# Patient Record
Sex: Female | Born: 1945 | Race: Black or African American | Hispanic: No | Marital: Married | Smoking: Former smoker
Health system: Southern US, Community
[De-identification: ages and names within clinical notes are randomized; demographics above are authoritative.]

## PROBLEM LIST (undated history)

## (undated) DIAGNOSIS — N289 Disorder of kidney and ureter, unspecified: Secondary | ICD-10-CM

## (undated) DIAGNOSIS — I2510 Atherosclerotic heart disease of native coronary artery without angina pectoris: Secondary | ICD-10-CM

## (undated) DIAGNOSIS — I81 Portal vein thrombosis: Secondary | ICD-10-CM

## (undated) DIAGNOSIS — E119 Type 2 diabetes mellitus without complications: Secondary | ICD-10-CM

## (undated) DIAGNOSIS — I10 Essential (primary) hypertension: Secondary | ICD-10-CM

## (undated) DIAGNOSIS — I480 Paroxysmal atrial fibrillation: Secondary | ICD-10-CM

## (undated) DIAGNOSIS — C9000 Multiple myeloma not having achieved remission: Secondary | ICD-10-CM

## (undated) HISTORY — PX: CORONARY ARTERY BYPASS GRAFT: SHX141

## (undated) HISTORY — PX: PARTIAL HYSTERECTOMY: SHX80

## (undated) HISTORY — PX: COLONOSCOPY: SHX174

## (undated) HISTORY — PX: NASAL SINUS SURGERY: SHX719

## (undated) HISTORY — PX: ESOPHAGOGASTRODUODENOSCOPY: SHX1529

---

## 1999-08-04 ENCOUNTER — Other Ambulatory Visit: Admission: RE | Admit: 1999-08-04 | Discharge: 1999-08-04 | Payer: Self-pay | Admitting: Gynecology

## 2000-10-15 ENCOUNTER — Inpatient Hospital Stay (HOSPITAL_COMMUNITY): Admission: EM | Admit: 2000-10-15 | Discharge: 2000-10-18 | Payer: Self-pay | Admitting: Emergency Medicine

## 2000-10-17 ENCOUNTER — Encounter: Payer: Self-pay | Admitting: Endocrinology

## 2000-10-19 ENCOUNTER — Inpatient Hospital Stay (HOSPITAL_COMMUNITY): Admission: EM | Admit: 2000-10-19 | Discharge: 2000-10-22 | Payer: Self-pay

## 2000-10-19 ENCOUNTER — Encounter: Payer: Self-pay | Admitting: Endocrinology

## 2000-10-25 ENCOUNTER — Other Ambulatory Visit: Admission: RE | Admit: 2000-10-25 | Discharge: 2000-10-25 | Payer: Self-pay | Admitting: Gynecology

## 2001-01-03 ENCOUNTER — Ambulatory Visit (HOSPITAL_COMMUNITY): Admission: RE | Admit: 2001-01-03 | Discharge: 2001-01-04 | Payer: Self-pay | Admitting: Cardiology

## 2001-01-04 ENCOUNTER — Encounter: Payer: Self-pay | Admitting: Cardiology

## 2002-09-14 ENCOUNTER — Ambulatory Visit (HOSPITAL_COMMUNITY): Admission: RE | Admit: 2002-09-14 | Discharge: 2002-09-14 | Payer: Self-pay | Admitting: Cardiology

## 2006-01-18 ENCOUNTER — Emergency Department (HOSPITAL_COMMUNITY): Admission: EM | Admit: 2006-01-18 | Discharge: 2006-01-18 | Payer: Self-pay | Admitting: Emergency Medicine

## 2010-12-07 HISTORY — PX: PACEMAKER IMPLANT: EP1218

## 2017-08-23 ENCOUNTER — Emergency Department (HOSPITAL_COMMUNITY)
Admission: EM | Admit: 2017-08-23 | Discharge: 2017-08-23 | Disposition: A | Discharge status: Home / Self Care | Payer: Medicare Other | Attending: Emergency Medicine | Admitting: Emergency Medicine

## 2017-08-23 ENCOUNTER — Encounter (HOSPITAL_COMMUNITY): Payer: Self-pay | Admitting: Emergency Medicine

## 2017-08-23 DIAGNOSIS — E119 Type 2 diabetes mellitus without complications: Secondary | ICD-10-CM | POA: Insufficient documentation

## 2017-08-23 DIAGNOSIS — Z760 Encounter for issue of repeat prescription: Secondary | ICD-10-CM | POA: Diagnosis present

## 2017-08-23 HISTORY — DX: Disorder of kidney and ureter, unspecified: N28.9

## 2017-08-23 LAB — CBG MONITORING, ED: Glucose-Capillary: 183 mg/dL — ABNORMAL HIGH (ref 65–99)

## 2017-08-23 MED ORDER — FREESTYLE LIBRE 14 DAY READER DEVI: 1.0000 | Freq: Once | 0 refills | Status: AC

## 2017-08-23 MED ORDER — INSULIN GLARGINE 100 UNIT/ML ~~LOC~~ SOLN: 50.0000 [IU] | Freq: Two times a day (BID) | SUBCUTANEOUS | 0 refills | Status: DC

## 2017-08-23 MED ORDER — INSULIN LISPRO 100 UNIT/ML ~~LOC~~ SOLN: 16.0000 [IU] | Freq: Three times a day (TID) | SUBCUTANEOUS | 0 refills | Status: DC

## 2017-08-23 NOTE — ED Triage Notes (Signed)
Pt here for refills of lantis, humulin, and she states her insulin monitoring [place] today[place]. [place].

## 2017-08-23 NOTE — ED Provider Notes (Signed)
[facility] DEPT Provider Note   CSN: [id number] Arrival date & time: 08/23/17  1134     History   Chief Complaint Chief Complaint  Patient presents with  . Medication Refill    HPI Laura Sampson is a 71 y.o. female with a history of diabetes without, location who presents to the emergency department today for medication refill. [place] She only packed supplies for approximately 3 days so she is almost out of her Lantus, Humulin and insulin monitoring system. She states she has not missed any doses but only has one left. She called her primary care doctor who says due to the weather they will be unable to get into the office to refill her prescription for quite some time. Patient is not having any symptoms at this time.  HPI  Past Medical History:  Diagnosis Date  . Diabetes mellitus without complication ([place])   . Renal disorder    "low functioning" no treatment needed at this time    There are no active problems to display for this patient.   Past Surgical History:  Procedure Laterality Date  . NASAL SINUS SURGERY      OB History    No data available       Home Medications    Prior to Admission medications   Medication Sig Start Date End Date Taking? Authorizing Provider  Continuous Blood Gluc Receiver (FREESTYLE LIBRE 14 DAY READER) DEVI 1 Device by Does not apply route once. 08/23/17 08/23/17  Daira Hine, Barth Kirks, PA-C  insulin glargine (LANTUS) 100 UNIT/ML injection Inject 0.5 mLs (50 Units total) into the skin 2 (two) times daily. Inject 50 units subcutaneously every morning and 54 units subcutaneously every evening. 08/23/17   Karolyne Timmons, Barth Kirks, PA-C  insulin lispro (HUMALOG) 100 UNIT/ML injection Inject 0.16 mLs (16 Units total) into the skin 3 (three) times daily before meals. 08/23/17   Makhya Arave, Barth Kirks, PA-C    Family History No family history on file.  Social History Social History  Substance Use  Topics  . Smoking status: Never Smoker  . Smokeless tobacco: Not on file  . Alcohol use No     Allergies   Clindamycin/lincomycin and Penicillins   Review of Systems Review of Systems  Constitutional: Negative for chills and fever.  Respiratory: Negative for shortness of breath.   Cardiovascular: Negative for chest pain.  Gastrointestinal: Negative for nausea and vomiting.  Endocrine: Negative for polydipsia, polyphagia and polyuria.  Neurological: Negative for dizziness, weakness and numbness.     Physical Exam Updated Vital Signs BP (!) 147/64 (BP Location: Left Arm)   Pulse 69   Temp 98.1 F (36.7 C) (Oral)   Resp 15   SpO2 96%   Physical Exam  Constitutional: She appears well-developed and well-nourished.  HENT:  Head: Normocephalic and atraumatic.  Right Ear: External ear normal.  Left Ear: External ear normal.  Eyes: Conjunctivae are normal. Right eye exhibits no discharge. Left eye exhibits no discharge. No scleral icterus.  Pulmonary/Chest: Effort normal. No respiratory distress.  Neurological: She is alert.  Skin: No pallor.  Psychiatric: She has a normal mood and affect.  Nursing note and vitals reviewed.    ED Treatments / Results  Labs (all labs ordered are listed, but only abnormal results are displayed) Labs Reviewed  CBG MONITORING, ED - Abnormal; Notable for the following:       Result Value   Glucose-Capillary 183 (*)  All other components within normal limits    EKG  EKG Interpretation None       Radiology No results found.  Procedures Procedures (including critical care time)  Medications Ordered in ED Medications - No data to display   Initial Impression / Assessment and Plan / ED Course  I have reviewed the triage vital signs and the nursing notes.  Pertinent labs & imaging results that were available during my care of the patient were reviewed by me and considered in my medical decision making (see chart for  details).     Pt here for refill of medication. Medication is not a controlled substance. Will refill medication here. Discussed need to follow up with PCP ASAP.  Pt is safe for discharge at this time.  Final Clinical Impressions(s) / ED Diagnoses   Final diagnoses:  Medication refill    New Prescriptions New Prescriptions   CONTINUOUS BLOOD GLUC RECEIVER (FREESTYLE LIBRE 14 DAY READER) DEVI    1 Device by Does not apply route once.   INSULIN GLARGINE (LANTUS) 100 UNIT/ML INJECTION    Inject 0.5 mLs (50 Units total) into the skin 2 (two) times daily. Inject 50 units subcutaneously every morning and 54 units subcutaneously every evening.   INSULIN LISPRO (HUMALOG) 100 UNIT/ML INJECTION    Inject 0.16 mLs (16 Units total) into the skin 3 (three) times daily before meals.     [name] 08/23/17 1629    Davonna Belling, MD 08/23/17 [phone]

## 2017-08-23 NOTE — Discharge Instructions (Signed)
You were seen here today mediation refill. Take as prescribed. Return for any concerning symptoms. Follow up with your PCP ASAP.

## 2017-11-30 ENCOUNTER — Emergency Department (HOSPITAL_COMMUNITY)
Admission: EM | Admit: 2017-11-30 | Discharge: 2017-11-30 | Disposition: A | Discharge status: Home / Self Care | Payer: Medicare Other | Attending: Emergency Medicine | Admitting: Emergency Medicine

## 2017-11-30 ENCOUNTER — Emergency Department (HOSPITAL_COMMUNITY): Payer: Medicare Other

## 2017-11-30 ENCOUNTER — Encounter (HOSPITAL_COMMUNITY): Payer: Self-pay | Admitting: Radiology

## 2017-11-30 DIAGNOSIS — R569 Unspecified convulsions: Secondary | ICD-10-CM | POA: Insufficient documentation

## 2017-11-30 DIAGNOSIS — E11649 Type 2 diabetes mellitus with hypoglycemia without coma: Secondary | ICD-10-CM | POA: Insufficient documentation

## 2017-11-30 DIAGNOSIS — D649 Anemia, unspecified: Secondary | ICD-10-CM | POA: Insufficient documentation

## 2017-11-30 DIAGNOSIS — N289 Disorder of kidney and ureter, unspecified: Secondary | ICD-10-CM | POA: Diagnosis not present

## 2017-11-30 DIAGNOSIS — R0789 Other chest pain: Secondary | ICD-10-CM | POA: Insufficient documentation

## 2017-11-30 DIAGNOSIS — E162 Hypoglycemia, unspecified: Secondary | ICD-10-CM

## 2017-11-30 DIAGNOSIS — Z7982 Long term (current) use of aspirin: Secondary | ICD-10-CM | POA: Diagnosis not present

## 2017-11-30 DIAGNOSIS — Z794 Long term (current) use of insulin: Secondary | ICD-10-CM | POA: Diagnosis not present

## 2017-11-30 LAB — COMPREHENSIVE METABOLIC PANEL
ALT: 33 U/L (ref 14–54)
ANION GAP: 6 (ref 5–15)
AST: 112 U/L — ABNORMAL HIGH (ref 15–41)
Albumin: 3.5 g/dL (ref 3.5–5.0)
Alkaline Phosphatase: 93 U/L (ref 38–126)
BILIRUBIN TOTAL: 0.8 mg/dL (ref 0.3–1.2)
BUN: 33 mg/dL — ABNORMAL HIGH (ref 6–20)
CHLORIDE: 106 mmol/L (ref 101–111)
CO2: 25 mmol/L (ref 22–32)
Calcium: 8.8 mg/dL — ABNORMAL LOW (ref 8.9–10.3)
Creatinine, Ser: 1.77 mg/dL — ABNORMAL HIGH (ref 0.44–1.00)
GFR calc Af Amer: 32 mL/min — ABNORMAL LOW (ref >60)
GFR, EST NON AFRICAN AMERICAN: 28 mL/min — AB (ref >60)
Glucose, Bld: 83 mg/dL (ref 65–99)
POTASSIUM: 6.1 mmol/L — AB (ref 3.5–5.1)
Sodium: 137 mmol/L (ref 135–145)
TOTAL PROTEIN: 8.2 g/dL — AB (ref 6.5–8.1)

## 2017-11-30 LAB — URINALYSIS, ROUTINE W REFLEX MICROSCOPIC
BILIRUBIN URINE: NEGATIVE
Glucose, UA: NEGATIVE mg/dL
KETONES UR: NEGATIVE mg/dL
LEUKOCYTES UA: NEGATIVE
Nitrite: NEGATIVE
PH: 5 (ref 5.0–8.0)
Protein, ur: NEGATIVE mg/dL
Specific Gravity, Urine: 1.008 (ref 1.005–1.030)

## 2017-11-30 LAB — CBG MONITORING, ED
GLUCOSE-CAPILLARY: 102 mg/dL — AB (ref 65–99)
GLUCOSE-CAPILLARY: 97 mg/dL (ref 65–99)
GLUCOSE-CAPILLARY: 155 mg/dL — AB (ref 65–99)
Glucose-Capillary: 101 mg/dL — ABNORMAL HIGH (ref 65–99)
Glucose-Capillary: 86 mg/dL (ref 65–99)
Glucose-Capillary: 134 mg/dL — ABNORMAL HIGH (ref 65–99)

## 2017-11-30 LAB — CBC WITH DIFFERENTIAL/PLATELET
BASOS ABS: 0 10*3/uL (ref 0.0–0.1)
Basophils Relative: 0 %
EOS PCT: 6 %
Eosinophils Absolute: 0.2 10*3/uL (ref 0.0–0.7)
HEMATOCRIT: 30.7 % — AB (ref 36.0–46.0)
HEMOGLOBIN: 9.7 g/dL — AB (ref 12.0–15.0)
LYMPHS PCT: 36 %
Lymphs Abs: 1.2 10*3/uL (ref 0.7–4.0)
MCH: 29.3 pg (ref 26.0–34.0)
MCHC: 31.6 g/dL (ref 30.0–36.0)
MCV: 92.7 fL (ref 78.0–100.0)
Monocytes Absolute: 0.3 10*3/uL (ref 0.1–1.0)
Monocytes Relative: 10 %
NEUTROS ABS: 1.6 10*3/uL — AB (ref 1.7–7.7)
NEUTROS PCT: 48 %
PLATELETS: 148 10*3/uL — AB (ref 150–400)
RBC: 3.31 MIL/uL — AB (ref 3.87–5.11)
RDW: 16.9 % — ABNORMAL HIGH (ref 11.5–15.5)
WBC: 3.3 10*3/uL — AB (ref 4.0–10.5)

## 2017-11-30 LAB — BASIC METABOLIC PANEL
Anion gap: 5 (ref 5–15)
BUN: 28 mg/dL — AB (ref 6–20)
CHLORIDE: 106 mmol/L (ref 101–111)
CO2: 28 mmol/L (ref 22–32)
Calcium: 8.9 mg/dL (ref 8.9–10.3)
Creatinine, Ser: 1.55 mg/dL — ABNORMAL HIGH (ref 0.44–1.00)
GFR calc Af Amer: 38 mL/min — ABNORMAL LOW (ref >60)
GFR, EST NON AFRICAN AMERICAN: 33 mL/min — AB (ref >60)
GLUCOSE: 160 mg/dL — AB (ref 65–99)
POTASSIUM: 3.9 mmol/L (ref 3.5–5.1)
Sodium: 139 mmol/L (ref 135–145)

## 2017-11-30 MED ORDER — SODIUM CHLORIDE 0.9 % IV BOLUS (SEPSIS)
1000.0000 mL | Freq: Once | INTRAVENOUS | Status: AC
  Administered 2017-11-30: 1000 mL via INTRAVENOUS

## 2017-11-30 MED ORDER — ONDANSETRON HCL 4 MG/2ML IJ SOLN
4.0000 mg | Freq: Once | INTRAMUSCULAR | Status: AC
  Administered 2017-11-30: 4 mg via INTRAVENOUS
  Filled 2017-11-30: qty 2

## 2017-11-30 NOTE — ED Notes (Signed)
Pt given a [place] sandwich, cheese and orange juice

## 2017-11-30 NOTE — ED Notes (Signed)
ED Provider at bedside. 

## 2017-11-30 NOTE — Discharge Instructions (Signed)
You had a seizure tonight because your blood sugar got too low.  Your blood tests showed your kidneys are not working as well as they should, and worse than they were recently.  Creatinine today is 1.78 compared with 1.5 last month.  Because of this, I would like you to decrease your Lantus dose from 14 units twice a day to 10 units twice a day.  Please continue to monitor your blood sugars at home.  If they are running too high, you may need to increase your Lantus dose again.  Call your doctor with this information to get his recommendation on how to adjust your dose.

## 2017-11-30 NOTE — ED Provider Notes (Signed)
Visit shared.  Patient with history of diabetes here following seizure x2 due to hypoglycemia.  She has been eating and drinking well.  No recent medication changes.  On evaluation in the emergency department she is alert and at her neurologic baseline.  She has no acute complaints.  Recheck of her BMP demonstrates improvement in her renal function and resolution of her hyperkalemia.  CT head with no acute abnormalities.  Discussed with patient home care regarding hypoglycemia and seizures.  Discussed regular meals and decreasing her Lantus.  Discussed outpatient follow-up and return precautions.   Quintella Reichert, MD 11/30/17 [phone]

## 2017-11-30 NOTE — ED Notes (Signed)
Family concerned about patient going home after speaking with Primary Care Doctor on call. Spoke with EDP Dr. Ayesha Rumpf about situation. New orders for EKG given. Dr. Ayesha Rumpf will assess momentarily.

## 2017-11-30 NOTE — ED Provider Notes (Signed)
[facility] DEPT Provider Note   CSN: [id number] Arrival date & time: 11/30/17  8657     History   Chief Complaint Chief Complaint  Patient presents with  . Seizures    HPI Laura Sampson is a 71 y.o. female.  The history is provided by the patient and a relative.  She has a history of diabetes mellitus and has had seizures in the past related to hypoglycemia.  Tonight, she woke up and felt funny and tried to check her glucose, but her machine was not reading her glucose.  She woke family members who noted that she was talking incoherently.  She then had a generalized seizure.  The seizure stopped, but she did have a second seizure.  She did not regained normal mentation after either of the seizures.  There was no incontinence and no bit lip or tongue.  The family had been able to give her some sugar prior to the seizure.  EMS arrived, and glucose was 171.  She did have a third seizure.  She was awake and back to her normal mentation when family saw her on arrival to the ED.  Also, she is status post coronary artery bypass in early October.  She had been to urgent care 2 weeks ago for ongoing pain in her left rib cage.  She is continuing to complain of that pain tonight.  Past Medical History:  Diagnosis Date  . Diabetes mellitus without complication ([place])   . Renal disorder    "low functioning" no treatment needed at this time    There are no active problems to display for this patient.   Past Surgical History:  Procedure Laterality Date  . NASAL SINUS SURGERY      OB History    No data available       Home Medications    Prior to Admission medications   Medication Sig Start Date End Date Taking? Authorizing Provider  aspirin EC 81 MG tablet Take 81 mg by mouth daily.   Yes [provider]  insulin glargine (LANTUS) 100 UNIT/ML injection Inject 0.5 mLs (50 Units total) into the skin 2 (two) times daily. Inject 50 units  subcutaneously every morning and 54 units subcutaneously every evening. 08/23/17   Maczis, Barth Kirks, PA-C  insulin lispro (HUMALOG) 100 UNIT/ML injection Inject 0.16 mLs (16 Units total) into the skin 3 (three) times daily before meals. 08/23/17   Maczis, Barth Kirks, PA-C    Family History No family history on file.  Social History Social History   Tobacco Use  . Smoking status: Never Smoker  Substance Use Topics  . Alcohol use: No  . Drug use: No     Allergies   Penicillins and Clindamycin/lincomycin   Review of Systems Review of Systems  All other systems reviewed and are negative.    Physical Exam Updated Vital Signs BP (!) 146/64   Pulse 60   Temp 97.8 F (36.6 C) (Oral)   Resp 18   SpO2 97%   Physical Exam  Nursing note and vitals reviewed.  71 year old female, resting comfortably and in no acute distress. Vital signs are significant for mild hypertension. Oxygen saturation is 97%, which is normal. Head is normocephalic and atraumatic. PERRLA, EOMI. Oropharynx is clear. Neck is nontender and supple without adenopathy or JVD. Back is nontender and there is no CVA tenderness. Lungs are clear without rales, wheezes, or rhonchi. Chest is tender over her sternotomy scar, and mildly  tender in the left lateral rib cage. Heart has regular rate and rhythm without murmur. Abdomen is soft, flat, nontender without masses or hepatosplenomegaly and peristalsis is normoactive. Extremities have no cyanosis or edema, full range of motion is present. Skin is warm and dry without rash. Neurologic: Mental status is normal, cranial nerves are intact, there are no motor or sensory deficits.  ED Treatments / Results  Labs (all labs ordered are listed, but only abnormal results are displayed) Labs Reviewed  CBG MONITORING, ED - Abnormal; Notable for the following components:      Result Value   Glucose-Capillary 101 (*)    All other components within normal limits  CBG  MONITORING, ED - Abnormal; Notable for the following components:   Glucose-Capillary 102 (*)    All other components within normal limits  COMPREHENSIVE METABOLIC PANEL  CBC WITH DIFFERENTIAL/PLATELET  URINALYSIS, ROUTINE W REFLEX MICROSCOPIC  CBG MONITORING, ED    Radiology Dg Chest 2 View  Result Date: 11/30/2017 CLINICAL DATA:  Acute onset of seizures and hypoglycemia. EXAM: CHEST  2 VIEW COMPARISON:  None. FINDINGS: The lungs are well-aerated and clear. There is no evidence of focal opacification, pleural effusion or pneumothorax. The heart is mildly enlarged. The patient is status post median sternotomy. A pacemaker is noted at the right chest wall, with leads ending at the right atrium and right ventricle. No acute osseous abnormalities are seen. Mild calcification is seen along the abdominal aorta. IMPRESSION: Mild cardiomegaly.  Lungs remain grossly clear. Electronically Signed   By: Garald Balding M.D.   On: 11/30/2017 05:52    Procedures Procedures (including critical care time)  Medications Ordered in ED Medications  ondansetron (ZOFRAN) injection 4 mg (not administered)     Initial Impression / Assessment and Plan / ED Course  I have reviewed the triage vital signs and the nursing notes.  Pertinent labs & imaging results that were available during my care of the patient were reviewed by me and considered in my medical decision making (see chart for details).  Seizures which apparently are related to episode of hypoglycemia.  Old records are reviewed showing recent urgent care visit and also hospitalization for bypass surgery.  She is now back to normal mentation.  I do believe the seizures were related to hypoglycemia.  I do not see indication for CT scan currently.  Will check screening labs and observe in the ED. chest wall pain is likely related to recent cardiac surgery, but will check chest x-ray.  Chest x-ray is unremarkable.  Laboratory workup shows anemia and renal  insufficiency.  Checking results in care everywhere, it is noted that creatinine has gone from 1.4-1.5, to 1.78.  Decreased insulin clearance with this change in creatinine may account for her hypoglycemic episode.  She is advised to decrease her Lantus insulin from 14 units twice a day, down to 10 units twice a day.  Continue the same mealtime coverage.  Continue monitoring blood glucose at home and adjust Lantus dose back up to sugars are running too high.  Final Clinical Impressions(s) / ED Diagnoses   Final diagnoses:  Hypoglycemia  Seizure with provoking factor ([place])  Renal insufficiency  Normochromic normocytic anemia    ED Discharge Orders    None       Delora Fuel, MD 67/61/95 [phone]

## 2017-11-30 NOTE — ED Triage Notes (Signed)
Pt BIB [facility]. EMS was called out for hypoglycemia and seizures. Family stated that the pt has seizures when her CBG drops. Family administered oral glucose prior to EMS arrival. When EMS arrived her CBG was normal at 171. Then once pt was loaded into the truck she had a 3rd seizure that lasted 45 seconds and grand mal in nature. Versed 2.5m was administered at that time. Pt is alert to verbal at this time.

## 2018-12-02 ENCOUNTER — Telehealth: Payer: Self-pay | Admitting: Hematology

## 2018-12-02 NOTE — Telephone Encounter (Signed)
Spoke to the pt's daughter and scheduled her to see Dr. Irene Limbo on 1/15 at [place] to arrive 30 minutes early. Voiced understanding.

## 2018-12-14 ENCOUNTER — Emergency Department (HOSPITAL_COMMUNITY): Payer: Medicare Other

## 2018-12-14 ENCOUNTER — Encounter (HOSPITAL_COMMUNITY): Payer: Self-pay | Admitting: Emergency Medicine

## 2018-12-14 ENCOUNTER — Other Ambulatory Visit: Payer: Self-pay

## 2018-12-14 ENCOUNTER — Inpatient Hospital Stay (HOSPITAL_COMMUNITY)
Admission: EM | Admit: 2018-12-14 | Discharge: 2018-12-22 | DRG: 432 | Disposition: A | Discharge status: Home Health | Payer: Medicare Other | Attending: Family Medicine | Admitting: Family Medicine

## 2018-12-14 DIAGNOSIS — R188 Other ascites: Secondary | ICD-10-CM | POA: Diagnosis present

## 2018-12-14 DIAGNOSIS — E119 Type 2 diabetes mellitus without complications: Secondary | ICD-10-CM

## 2018-12-14 DIAGNOSIS — R1084 Generalized abdominal pain: Secondary | ICD-10-CM

## 2018-12-14 DIAGNOSIS — E1122 Type 2 diabetes mellitus with diabetic chronic kidney disease: Secondary | ICD-10-CM | POA: Diagnosis present

## 2018-12-14 DIAGNOSIS — K7460 Unspecified cirrhosis of liver: Secondary | ICD-10-CM | POA: Diagnosis not present

## 2018-12-14 DIAGNOSIS — D62 Acute posthemorrhagic anemia: Secondary | ICD-10-CM

## 2018-12-14 DIAGNOSIS — E11649 Type 2 diabetes mellitus with hypoglycemia without coma: Secondary | ICD-10-CM | POA: Diagnosis not present

## 2018-12-14 DIAGNOSIS — Z79899 Other long term (current) drug therapy: Secondary | ICD-10-CM

## 2018-12-14 DIAGNOSIS — Z951 Presence of aortocoronary bypass graft: Secondary | ICD-10-CM

## 2018-12-14 DIAGNOSIS — Z881 Allergy status to other antibiotic agents status: Secondary | ICD-10-CM

## 2018-12-14 DIAGNOSIS — R140 Abdominal distension (gaseous): Secondary | ICD-10-CM

## 2018-12-14 DIAGNOSIS — R0789 Other chest pain: Secondary | ICD-10-CM

## 2018-12-14 DIAGNOSIS — Z7901 Long term (current) use of anticoagulants: Secondary | ICD-10-CM

## 2018-12-14 DIAGNOSIS — R160 Hepatomegaly, not elsewhere classified: Secondary | ICD-10-CM | POA: Diagnosis present

## 2018-12-14 DIAGNOSIS — C9000 Multiple myeloma not having achieved remission: Secondary | ICD-10-CM | POA: Diagnosis present

## 2018-12-14 DIAGNOSIS — R079 Chest pain, unspecified: Secondary | ICD-10-CM

## 2018-12-14 DIAGNOSIS — N184 Chronic kidney disease, stage 4 (severe): Secondary | ICD-10-CM | POA: Diagnosis present

## 2018-12-14 DIAGNOSIS — A0472 Enterocolitis due to Clostridium difficile, not specified as recurrent: Secondary | ICD-10-CM | POA: Diagnosis present

## 2018-12-14 DIAGNOSIS — I071 Rheumatic tricuspid insufficiency: Secondary | ICD-10-CM | POA: Diagnosis present

## 2018-12-14 DIAGNOSIS — Z803 Family history of malignant neoplasm of breast: Secondary | ICD-10-CM

## 2018-12-14 DIAGNOSIS — N179 Acute kidney failure, unspecified: Secondary | ICD-10-CM

## 2018-12-14 DIAGNOSIS — K8020 Calculus of gallbladder without cholecystitis without obstruction: Secondary | ICD-10-CM | POA: Diagnosis present

## 2018-12-14 DIAGNOSIS — Z90711 Acquired absence of uterus with remaining cervical stump: Secondary | ICD-10-CM

## 2018-12-14 DIAGNOSIS — R040 Epistaxis: Secondary | ICD-10-CM | POA: Diagnosis not present

## 2018-12-14 DIAGNOSIS — Z79890 Hormone replacement therapy (postmenopausal): Secondary | ICD-10-CM

## 2018-12-14 DIAGNOSIS — I81 Portal vein thrombosis: Secondary | ICD-10-CM | POA: Diagnosis present

## 2018-12-14 DIAGNOSIS — E872 Acidosis: Secondary | ICD-10-CM | POA: Diagnosis not present

## 2018-12-14 DIAGNOSIS — I10 Essential (primary) hypertension: Secondary | ICD-10-CM | POA: Diagnosis present

## 2018-12-14 DIAGNOSIS — I700 Atherosclerosis of aorta: Secondary | ICD-10-CM | POA: Diagnosis present

## 2018-12-14 DIAGNOSIS — I2510 Atherosclerotic heart disease of native coronary artery without angina pectoris: Secondary | ICD-10-CM | POA: Diagnosis present

## 2018-12-14 DIAGNOSIS — Z794 Long term (current) use of insulin: Secondary | ICD-10-CM

## 2018-12-14 DIAGNOSIS — Z87891 Personal history of nicotine dependence: Secondary | ICD-10-CM

## 2018-12-14 DIAGNOSIS — K7581 Nonalcoholic steatohepatitis (NASH): Secondary | ICD-10-CM | POA: Diagnosis present

## 2018-12-14 DIAGNOSIS — Z8619 Personal history of other infectious and parasitic diseases: Secondary | ICD-10-CM

## 2018-12-14 DIAGNOSIS — I480 Paroxysmal atrial fibrillation: Secondary | ICD-10-CM | POA: Diagnosis present

## 2018-12-14 DIAGNOSIS — Z8249 Family history of ischemic heart disease and other diseases of the circulatory system: Secondary | ICD-10-CM

## 2018-12-14 DIAGNOSIS — D631 Anemia in chronic kidney disease: Secondary | ICD-10-CM | POA: Diagnosis present

## 2018-12-14 DIAGNOSIS — Z880 Allergy status to penicillin: Secondary | ICD-10-CM

## 2018-12-14 DIAGNOSIS — Z833 Family history of diabetes mellitus: Secondary | ICD-10-CM

## 2018-12-14 DIAGNOSIS — R180 Malignant ascites: Secondary | ICD-10-CM

## 2018-12-14 DIAGNOSIS — Z950 Presence of cardiac pacemaker: Secondary | ICD-10-CM

## 2018-12-14 DIAGNOSIS — M549 Dorsalgia, unspecified: Secondary | ICD-10-CM | POA: Diagnosis not present

## 2018-12-14 DIAGNOSIS — R197 Diarrhea, unspecified: Secondary | ICD-10-CM

## 2018-12-14 DIAGNOSIS — I129 Hypertensive chronic kidney disease with stage 1 through stage 4 chronic kidney disease, or unspecified chronic kidney disease: Secondary | ICD-10-CM | POA: Diagnosis present

## 2018-12-14 DIAGNOSIS — E875 Hyperkalemia: Secondary | ICD-10-CM | POA: Diagnosis not present

## 2018-12-14 HISTORY — DX: Paroxysmal atrial fibrillation: I48.0

## 2018-12-14 HISTORY — DX: Portal vein thrombosis: I81

## 2018-12-14 HISTORY — DX: Type 2 diabetes mellitus without complications: E11.9

## 2018-12-14 HISTORY — DX: Essential (primary) hypertension: I10

## 2018-12-14 HISTORY — DX: Multiple myeloma not having achieved remission: C90.00

## 2018-12-14 HISTORY — DX: Atherosclerotic heart disease of native coronary artery without angina pectoris: I25.10

## 2018-12-14 MED ORDER — FENTANYL CITRATE (PF) 100 MCG/2ML IJ SOLN
50.0000 ug | Freq: Once | INTRAMUSCULAR | Status: AC
  Administered 2018-12-15: 50 ug via INTRAVENOUS
  Filled 2018-12-14: qty 2

## 2018-12-14 NOTE — ED Notes (Signed)
Patient transported to CT 

## 2018-12-14 NOTE — ED Triage Notes (Signed)
Pt c/o abd pain and bloating recent Hx chemo for mass on liver and did not tolerate well currently having a lot of abd pain loss of appetite and right lower back pain

## 2018-12-15 ENCOUNTER — Observation Stay (HOSPITAL_COMMUNITY): Payer: Medicare Other

## 2018-12-15 ENCOUNTER — Encounter (HOSPITAL_COMMUNITY): Payer: Self-pay | Admitting: Emergency Medicine

## 2018-12-15 DIAGNOSIS — K7460 Unspecified cirrhosis of liver: Secondary | ICD-10-CM | POA: Diagnosis not present

## 2018-12-15 DIAGNOSIS — Z794 Long term (current) use of insulin: Secondary | ICD-10-CM

## 2018-12-15 DIAGNOSIS — R1084 Generalized abdominal pain: Secondary | ICD-10-CM

## 2018-12-15 DIAGNOSIS — R188 Other ascites: Secondary | ICD-10-CM | POA: Diagnosis not present

## 2018-12-15 DIAGNOSIS — I10 Essential (primary) hypertension: Secondary | ICD-10-CM

## 2018-12-15 DIAGNOSIS — C9000 Multiple myeloma not having achieved remission: Secondary | ICD-10-CM

## 2018-12-15 DIAGNOSIS — N184 Chronic kidney disease, stage 4 (severe): Secondary | ICD-10-CM

## 2018-12-15 DIAGNOSIS — R160 Hepatomegaly, not elsewhere classified: Secondary | ICD-10-CM

## 2018-12-15 DIAGNOSIS — R197 Diarrhea, unspecified: Secondary | ICD-10-CM

## 2018-12-15 DIAGNOSIS — I480 Paroxysmal atrial fibrillation: Secondary | ICD-10-CM | POA: Diagnosis present

## 2018-12-15 DIAGNOSIS — E1122 Type 2 diabetes mellitus with diabetic chronic kidney disease: Secondary | ICD-10-CM

## 2018-12-15 DIAGNOSIS — A0472 Enterocolitis due to Clostridium difficile, not specified as recurrent: Secondary | ICD-10-CM

## 2018-12-15 DIAGNOSIS — E119 Type 2 diabetes mellitus without complications: Secondary | ICD-10-CM

## 2018-12-15 LAB — APTT
APTT: 34 s (ref 24–36)
aPTT: 59 seconds — ABNORMAL HIGH (ref 24–36)

## 2018-12-15 LAB — BODY FLUID CELL COUNT WITH DIFFERENTIAL
Eos, Fluid: 1 %
Lymphs, Fluid: 73 %
Monocyte-Macrophage-Serous Fluid: 13 % — ABNORMAL LOW (ref 50–90)
Neutrophil Count, Fluid: 13 % (ref 0–25)
WBC FLUID: 85 uL (ref 0–1000)

## 2018-12-15 LAB — CBC WITH DIFFERENTIAL/PLATELET
Abs Immature Granulocytes: 0.01 10*3/uL (ref 0.00–0.07)
Basophils Absolute: 0 10*3/uL (ref 0.0–0.1)
Basophils Relative: 1 %
EOS ABS: 0.2 10*3/uL (ref 0.0–0.5)
Eosinophils Relative: 5 %
HCT: 31.9 % — ABNORMAL LOW (ref 36.0–46.0)
Hemoglobin: 10.1 g/dL — ABNORMAL LOW (ref 12.0–15.0)
IMMATURE GRANULOCYTES: 0 %
Lymphocytes Relative: 11 %
Lymphs Abs: 0.5 10*3/uL — ABNORMAL LOW (ref 0.7–4.0)
MCH: 30.6 pg (ref 26.0–34.0)
MCHC: 31.7 g/dL (ref 30.0–36.0)
MCV: 96.7 fL (ref 80.0–100.0)
Monocytes Absolute: 0.4 10*3/uL (ref 0.1–1.0)
Monocytes Relative: 8 %
Neutro Abs: 3.3 10*3/uL (ref 1.7–7.7)
Neutrophils Relative %: 75 %
Platelets: 134 10*3/uL — ABNORMAL LOW (ref 150–400)
RBC: 3.3 MIL/uL — ABNORMAL LOW (ref 3.87–5.11)
RDW: 18.9 % — ABNORMAL HIGH (ref 11.5–15.5)
WBC: 4.5 10*3/uL (ref 4.0–10.5)
nRBC: 0 % (ref 0.0–0.2)

## 2018-12-15 LAB — GLUCOSE, CAPILLARY
GLUCOSE-CAPILLARY: 163 mg/dL — AB (ref 70–99)
Glucose-Capillary: 176 mg/dL — ABNORMAL HIGH (ref 70–99)
Glucose-Capillary: 177 mg/dL — ABNORMAL HIGH (ref 70–99)

## 2018-12-15 LAB — IRON AND TIBC
Iron: 44 ug/dL (ref 28–170)
SATURATION RATIOS: 14 % (ref 10.4–31.8)
TIBC: 313 ug/dL (ref 250–450)
UIBC: 269 ug/dL

## 2018-12-15 LAB — I-STAT CHEM 8, ED
BUN: 50 mg/dL — ABNORMAL HIGH (ref 8–23)
Calcium, Ion: 0.86 mmol/L — CL (ref 1.15–1.40)
Chloride: 117 mmol/L — ABNORMAL HIGH (ref 98–111)
Creatinine, Ser: 2 mg/dL — ABNORMAL HIGH (ref 0.44–1.00)
Glucose, Bld: 132 mg/dL — ABNORMAL HIGH (ref 70–99)
HEMATOCRIT: 25 % — AB (ref 36.0–46.0)
Hemoglobin: 8.5 g/dL — ABNORMAL LOW (ref 12.0–15.0)
Potassium: 4.8 mmol/L (ref 3.5–5.1)
Sodium: 142 mmol/L (ref 135–145)
TCO2: 19 mmol/L — ABNORMAL LOW (ref 22–32)

## 2018-12-15 LAB — HEPATIC FUNCTION PANEL
ALT: 27 U/L (ref 0–44)
AST: 210 U/L — ABNORMAL HIGH (ref 15–41)
Albumin: 3 g/dL — ABNORMAL LOW (ref 3.5–5.0)
Alkaline Phosphatase: 454 U/L — ABNORMAL HIGH (ref 38–126)
Bilirubin, Direct: 0.2 mg/dL (ref 0.0–0.2)
Indirect Bilirubin: 0.5 mg/dL (ref 0.3–0.9)
Total Bilirubin: 0.7 mg/dL (ref 0.3–1.2)
Total Protein: 8.2 g/dL — ABNORMAL HIGH (ref 6.5–8.1)

## 2018-12-15 LAB — VITAMIN B12: Vitamin B-12: 485 pg/mL (ref 180–914)

## 2018-12-15 LAB — RETICULOCYTES
Immature Retic Fract: 15.1 % (ref 2.3–15.9)
RBC.: 3.25 MIL/uL — ABNORMAL LOW (ref 3.87–5.11)
Retic Count, Absolute: 42.9 10*3/uL (ref 19.0–186.0)
Retic Ct Pct: 1.3 % (ref 0.4–3.1)

## 2018-12-15 LAB — C DIFFICILE QUICK SCREEN W PCR REFLEX
C Diff antigen: NEGATIVE
C Diff interpretation: NOT DETECTED
C Diff toxin: NEGATIVE

## 2018-12-15 LAB — FOLATE: Folate: 8 ng/mL (ref >5.9)

## 2018-12-15 LAB — LIPASE, BLOOD: Lipase: 27 U/L (ref 11–51)

## 2018-12-15 LAB — HEPARIN LEVEL (UNFRACTIONATED)
Heparin Unfractionated: 1.46 IU/mL — ABNORMAL HIGH (ref 0.30–0.70)
Heparin Unfractionated: 0.88 IU/mL — ABNORMAL HIGH (ref 0.30–0.70)
Heparin Unfractionated: 0.75 IU/mL — ABNORMAL HIGH (ref 0.30–0.70)

## 2018-12-15 LAB — FERRITIN: Ferritin: 572 ng/mL — ABNORMAL HIGH (ref 11–307)

## 2018-12-15 MED ORDER — VANCOMYCIN 50 MG/ML ORAL SOLUTION: 125.0000 mg | ORAL | Status: DC

## 2018-12-15 MED ORDER — LOPERAMIDE HCL 2 MG PO CAPS
2.0000 mg | ORAL_CAPSULE | ORAL | Status: DC | PRN
  Administered 2018-12-16: 2 mg via ORAL
  Filled 2018-12-15 (×2): qty 1

## 2018-12-15 MED ORDER — HEPARIN (PORCINE) 25000 UT/250ML-% IV SOLN
1100.0000 [IU]/h | INTRAVENOUS | Status: DC
  Administered 2018-12-15: 1100 [IU]/h via INTRAVENOUS
  Filled 2018-12-15: qty 250

## 2018-12-15 MED ORDER — VANCOMYCIN 50 MG/ML ORAL SOLUTION: 125.0000 mg | Freq: Every day | ORAL | Status: DC

## 2018-12-15 MED ORDER — VANCOMYCIN 50 MG/ML ORAL SOLUTION
125.0000 mg | Freq: Four times a day (QID) | ORAL | Status: DC
  Filled 2018-12-15 (×3): qty 2.5

## 2018-12-15 MED ORDER — OXYCODONE HCL 5 MG PO TABS
5.0000 mg | ORAL_TABLET | ORAL | Status: DC | PRN
  Administered 2018-12-15 – 2018-12-22 (×7): 5 mg via ORAL
  Filled 2018-12-15 (×7): qty 1

## 2018-12-15 MED ORDER — CALCITRIOL 0.25 MCG PO CAPS
0.2500 ug | ORAL_CAPSULE | Freq: Every day | ORAL | Status: DC
  Administered 2018-12-16 – 2018-12-22 (×7): 0.25 ug via ORAL
  Filled 2018-12-15 (×8): qty 1

## 2018-12-15 MED ORDER — MAGNESIUM OXIDE 400 (241.3 MG) MG PO TABS
400.0000 mg | ORAL_TABLET | Freq: Every day | ORAL | Status: DC
  Administered 2018-12-16 – 2018-12-22 (×7): 400 mg via ORAL
  Filled 2018-12-15 (×7): qty 1

## 2018-12-15 MED ORDER — SODIUM BICARBONATE 650 MG PO TABS
650.0000 mg | ORAL_TABLET | Freq: Two times a day (BID) | ORAL | Status: DC
  Administered 2018-12-15 – 2018-12-22 (×13): 650 mg via ORAL
  Filled 2018-12-15 (×13): qty 1

## 2018-12-15 MED ORDER — CARVEDILOL 3.125 MG PO TABS
3.1250 mg | ORAL_TABLET | Freq: Two times a day (BID) | ORAL | Status: DC
  Administered 2018-12-15 – 2018-12-22 (×14): 3.125 mg via ORAL
  Filled 2018-12-15 (×14): qty 1

## 2018-12-15 MED ORDER — DIGOXIN 125 MCG PO TABS
0.1250 mg | ORAL_TABLET | Freq: Every day | ORAL | Status: DC
  Administered 2018-12-16 – 2018-12-22 (×7): 0.125 mg via ORAL
  Filled 2018-12-15 (×8): qty 1

## 2018-12-15 MED ORDER — SPIRONOLACTONE 25 MG PO TABS: 25.0000 mg | ORAL_TABLET | Freq: Every day | ORAL | Status: DC

## 2018-12-15 MED ORDER — MAGNESIUM OXIDE 400 MG PO TABS: 400.0000 mg | ORAL_TABLET | Freq: Every day | ORAL | Status: DC

## 2018-12-15 MED ORDER — ALLOPURINOL 100 MG PO TABS
200.0000 mg | ORAL_TABLET | Freq: Every day | ORAL | Status: DC
  Administered 2018-12-16 – 2018-12-22 (×7): 200 mg via ORAL
  Filled 2018-12-15 (×8): qty 2

## 2018-12-15 MED ORDER — ONDANSETRON HCL 4 MG/2ML IJ SOLN
4.0000 mg | Freq: Four times a day (QID) | INTRAMUSCULAR | Status: DC | PRN
  Administered 2018-12-19 – 2018-12-20 (×2): 4 mg via INTRAVENOUS
  Filled 2018-12-15 (×2): qty 2

## 2018-12-15 MED ORDER — INSULIN ASPART 100 UNIT/ML ~~LOC~~ SOLN: 0.0000 [IU] | Freq: Three times a day (TID) | SUBCUTANEOUS | Status: DC

## 2018-12-15 MED ORDER — ONDANSETRON HCL 4 MG PO TABS: 4.0000 mg | ORAL_TABLET | Freq: Four times a day (QID) | ORAL | Status: DC | PRN

## 2018-12-15 MED ORDER — HYDRALAZINE HCL 25 MG PO TABS
25.0000 mg | ORAL_TABLET | Freq: Three times a day (TID) | ORAL | Status: DC
  Administered 2018-12-15 – 2018-12-19 (×13): 25 mg via ORAL
  Filled 2018-12-15 (×14): qty 1

## 2018-12-15 MED ORDER — INSULIN NPH (HUMAN) (ISOPHANE) 100 UNIT/ML ~~LOC~~ SUSP: 45.0000 [IU] | Freq: Two times a day (BID) | SUBCUTANEOUS | Status: DC

## 2018-12-15 MED ORDER — ACETAMINOPHEN 325 MG PO TABS
650.0000 mg | ORAL_TABLET | Freq: Four times a day (QID) | ORAL | Status: DC | PRN
  Administered 2018-12-15 – 2018-12-20 (×5): 650 mg via ORAL
  Filled 2018-12-15 (×5): qty 2

## 2018-12-15 MED ORDER — GABAPENTIN 100 MG PO CAPS
100.0000 mg | ORAL_CAPSULE | Freq: Two times a day (BID) | ORAL | Status: DC
  Administered 2018-12-15 – 2018-12-22 (×13): 100 mg via ORAL
  Filled 2018-12-15 (×14): qty 1

## 2018-12-15 MED ORDER — HEPARIN (PORCINE) 25000 UT/250ML-% IV SOLN
950.0000 [IU]/h | INTRAVENOUS | Status: DC
  Administered 2018-12-15: 950 [IU]/h via INTRAVENOUS
  Filled 2018-12-15: qty 250

## 2018-12-15 MED ORDER — FENTANYL CITRATE (PF) 100 MCG/2ML IJ SOLN: 25.0000 ug | INTRAMUSCULAR | Status: DC | PRN

## 2018-12-15 MED ORDER — ACETAMINOPHEN 650 MG RE SUPP: 650.0000 mg | Freq: Four times a day (QID) | RECTAL | Status: DC | PRN

## 2018-12-15 MED ORDER — LEVOTHYROXINE SODIUM 75 MCG PO TABS
75.0000 ug | ORAL_TABLET | Freq: Every day | ORAL | Status: DC
  Administered 2018-12-15 – 2018-12-22 (×7): 75 ug via ORAL
  Filled 2018-12-15 (×8): qty 1

## 2018-12-15 MED ORDER — VANCOMYCIN 50 MG/ML ORAL SOLUTION: 125.0000 mg | Freq: Two times a day (BID) | ORAL | Status: DC

## 2018-12-15 MED ORDER — INSULIN NPH (HUMAN) (ISOPHANE) 100 UNIT/ML ~~LOC~~ SUSP
35.0000 [IU] | Freq: Two times a day (BID) | SUBCUTANEOUS | Status: DC
  Administered 2018-12-15: 35 [IU] via SUBCUTANEOUS
  Filled 2018-12-15: qty 10

## 2018-12-15 MED ORDER — LIDOCAINE HCL 1 % IJ SOLN
INTRAMUSCULAR | Status: AC
  Filled 2018-12-15: qty 20

## 2018-12-15 MED ORDER — BUMETANIDE 0.5 MG PO TABS: 0.5000 mg | ORAL_TABLET | Freq: Every day | ORAL | Status: DC

## 2018-12-15 NOTE — Progress Notes (Signed)
I have seen and assessed patient and agree with Dr. Juleen China assessment and plan.  Patient is a pleasant 73 year old female history of type 2 diabetes, hypertension, coronary artery disease status post CABG, status post PP M, recent diagnosis of multiple myeloma, chronic kidney disease who was recently hospitalized outside hospital for abdominal pain swelling diarrhea during the work-up patient noted to have a cirrhotic liver.  Patient noted to have a positive C. difficile PCR and patient started on oral Vanco.  Patient also noted to be on Bumex and Aldactone.  Patient presented to the ED with worsening abdominal distention, ongoing diarrhea.  Patient awaiting ultrasound-guided paracentesis.  Repeat C. difficile PCR negative.  Supportive care.  No charge.

## 2018-12-15 NOTE — ED Provider Notes (Signed)
[facility] DEPT Provider Note   CSN: [id number] Arrival date & time: 12/14/18  2204     History   Chief Complaint Chief Complaint  Patient presents with  . Abdominal Pain    HPI Laura Sampson is a 73 y.o. female.  The history is provided by the patient and a relative.  Abdominal Pain  Pain location:  Generalized Pain quality: bloating   Pain radiates to:  Does not radiate Pain severity:  Severe Onset quality:  Gradual Duration:  5 weeks Timing:  Constant Progression:  Worsening Chronicity:  New Context: not alcohol use and not trauma   Relieved by:  Nothing Worsened by:  Nothing Ineffective treatments:  None tried Associated symptoms: diarrhea   Associated symptoms: no chest pain, no constipation, no dysuria, no fever and no shortness of breath   Risk factors: recent hospitalization   Risk factors: no alcohol abuse     Past Medical History:  Diagnosis Date  . Diabetes mellitus without complication ([place])   . Renal disorder    "low functioning" no treatment needed at this time    There are no active problems to display for this patient.   Past Surgical History:  Procedure Laterality Date  . NASAL SINUS SURGERY       OB History   No obstetric history on file.      Home Medications    Prior to Admission medications   Medication Sig Start Date End Date Taking? Authorizing Provider  acetaminophen (TYLENOL) 500 MG tablet Take 1,000 mg by mouth every 6 (six) hours as needed for mild pain, moderate pain, fever or headache.   Yes [provider]  allopurinol (ZYLOPRIM) 100 MG tablet Take 200 mg by mouth daily.   Yes [provider]  apixaban (ELIQUIS) 5 MG TABS tablet Take 5 mg by mouth 2 (two) times daily.   Yes [provider]  bumetanide (BUMEX) 0.5 MG tablet Take 0.5 mg by mouth daily.   Yes [provider]  calcitRIOL (ROCALTROL) 0.25 MCG capsule Take 0.25 mcg by mouth daily.   Yes  [provider]  carvedilol (COREG) 3.125 MG tablet Take 3.125 mg by mouth 2 (two) times daily with a meal.   Yes [provider]  digoxin (LANOXIN) 0.125 MG tablet Take 0.125 mg by mouth daily.    Yes [provider]  gabapentin (NEURONTIN) 100 MG capsule Take 100 mg by mouth 2 (two) times daily.   Yes [provider]  hydrALAZINE (APRESOLINE) 25 MG tablet Take 25 mg by mouth 3 (three) times daily.    Yes [provider]  insulin lispro (HUMALOG) 100 UNIT/ML injection Inject 0.16 mLs (16 Units total) into the skin 3 (three) times daily before meals. Patient taking differently: Inject 8 Units into the skin 3 (three) times daily before meals.  08/23/17  Yes Maczis, Barth Kirks, PA-C  insulin NPH Human (HUMULIN N,NOVOLIN N) 100 UNIT/ML injection Inject 45 Units into the skin 2 (two) times daily. 12/04/18 02/09/19 Yes [provider]  levothyroxine (SYNTHROID, LEVOTHROID) 75 MCG tablet Take 75 mcg by mouth daily before breakfast.   Yes [provider]  magnesium oxide (MAG-OX) 400 MG tablet Take 400 mg by mouth daily.   Yes [provider]  sodium bicarbonate 650 MG tablet Take 650 mg by mouth 2 (two) times daily.   Yes [provider]  spironolactone (ALDACTONE) 25 MG tablet Take 25 mg by mouth daily.   Yes [provider]  insulin glargine (LANTUS) 100 UNIT/ML injection Inject 0.5 mLs (50 Units total) into the skin 2 (two) times daily. Inject 50 units subcutaneously every morning and 54 units subcutaneously every evening. Patient not taking: Reported on 12/15/2018 08/23/17   Maczis, Barth Kirks, PA-C    Family History History reviewed. No pertinent family history.  Social History Social History   Tobacco Use  . Smoking status: Never Smoker  . Smokeless tobacco: Never Used  Substance Use Topics  . Alcohol use: No  . Drug use: No     Allergies   Penicillins and Clindamycin/lincomycin   Review of  Systems Review of Systems  Constitutional: Negative for diaphoresis and fever.  Respiratory: Negative for chest tightness and shortness of breath.   Cardiovascular: Negative for chest pain and leg swelling.  Gastrointestinal: Positive for abdominal pain and diarrhea. Negative for constipation.  Genitourinary: Negative for dysuria and pelvic pain.  All other systems reviewed and are negative.    Physical Exam Updated Vital Signs BP (!) 150/51 (BP Location: Left Arm)   Pulse (!) 102   Temp 98.4 F (36.9 C) (Oral)   Resp 17   SpO2 94%   Physical Exam Vitals signs and nursing note reviewed.  Constitutional:      Appearance: She is well-developed. She is not diaphoretic.  HENT:     Head: Normocephalic and atraumatic.     Mouth/Throat:     Mouth: Mucous membranes are moist.  Eyes:     Extraocular Movements: Extraocular movements intact.     Pupils: Pupils are equal, round, and reactive to light.  Cardiovascular:     Rate and Rhythm: Normal rate and regular rhythm.     Heart sounds: Normal heart sounds.  Pulmonary:     Effort: Pulmonary effort is normal. No respiratory distress.     Breath sounds: Normal breath sounds.  Abdominal:     General: Abdomen is protuberant. Bowel sounds are normal. There is distension. There are no signs of injury.     Palpations: Abdomen is soft. There is shifting dullness and fluid wave.     Tenderness: There is generalized abdominal tenderness.     Hernia: No hernia is present.  Genitourinary:    Rectum: Normal.  Skin:    General: Skin is warm and dry.     Capillary Refill: Capillary refill takes less than 2 seconds.  Neurological:     General: No focal deficit present.     Mental Status: She is alert.  Psychiatric:        Mood and Affect: Mood normal.        Behavior: Behavior normal.      ED Treatments / Results  Labs (all labs ordered are listed, but only abnormal results are displayed) Results for orders placed or performed during  the hospital encounter of 11/30/17  Comprehensive metabolic panel  Result Value Ref Range   Sodium 137 135 - 145 mmol/L   Potassium 6.1 (H) 3.5 - 5.1 mmol/L   Chloride 106 101 - 111 mmol/L   CO2 25 22 - 32 mmol/L   Glucose, Bld 83 65 - 99 mg/dL   BUN 33 (H) 6 - 20 mg/dL   Creatinine, Ser 1.77 (H) 0.44 - 1.00 mg/dL   Calcium 8.8 (L) 8.9 - 10.3 mg/dL   Total Protein 8.2 (H) 6.5 - 8.1 g/dL   Albumin 3.5 3.5 - 5.0 g/dL   AST 112 (H) 15 - 41 U/L   ALT 33 14 - 54  U/L   Alkaline Phosphatase 93 38 - 126 U/L   Total Bilirubin 0.8 0.3 - 1.2 mg/dL   GFR calc non Af Amer 28 (L) >60 mL/min   GFR calc Af Amer 32 (L) >60 mL/min   Anion gap 6 5 - 15  CBC with Differential  Result Value Ref Range   WBC 3.3 (L) 4.0 - 10.5 K/uL   RBC 3.31 (L) 3.87 - 5.11 MIL/uL   Hemoglobin 9.7 (L) 12.0 - 15.0 g/dL   HCT 30.7 (L) 36.0 - 46.0 %   MCV 92.7 78.0 - 100.0 fL   MCH 29.3 26.0 - 34.0 pg   MCHC 31.6 30.0 - 36.0 g/dL   RDW 16.9 (H) 11.5 - 15.5 %   Platelets 148 (L) 150 - 400 K/uL   Neutrophils Relative % 48 %   Neutro Abs 1.6 (L) 1.7 - 7.7 K/uL   Lymphocytes Relative 36 %   Lymphs Abs 1.2 0.7 - 4.0 K/uL   Monocytes Relative 10 %   Monocytes Absolute 0.3 0.1 - 1.0 K/uL   Eosinophils Relative 6 %   Eosinophils Absolute 0.2 0.0 - 0.7 K/uL   Basophils Relative 0 %   Basophils Absolute 0.0 0.0 - 0.1 K/uL  Urinalysis, Routine w reflex microscopic  Result Value Ref Range   Color, Urine STRAW (A) YELLOW   APPearance CLEAR CLEAR   Specific Gravity, Urine 1.008 1.005 - 1.030   pH 5.0 5.0 - 8.0   Glucose, UA NEGATIVE NEGATIVE mg/dL   Hgb urine dipstick SMALL (A) NEGATIVE   Bilirubin Urine NEGATIVE NEGATIVE   Ketones, ur NEGATIVE NEGATIVE mg/dL   Protein, ur NEGATIVE NEGATIVE mg/dL   Nitrite NEGATIVE NEGATIVE   Leukocytes, UA NEGATIVE NEGATIVE   RBC / HPF 0-5 0 - 5 RBC/hpf   WBC, UA 0-5 0 - 5 WBC/hpf   Bacteria, UA RARE (A) NONE SEEN   Squamous Epithelial / LPF 0-5 (A) NONE SEEN   Mucus PRESENT    Basic metabolic panel  Result Value Ref Range   Sodium 139 135 - 145 mmol/L   Potassium 3.9 3.5 - 5.1 mmol/L   Chloride 106 101 - 111 mmol/L   CO2 28 22 - 32 mmol/L   Glucose, Bld 160 (H) 65 - 99 mg/dL   BUN 28 (H) 6 - 20 mg/dL   Creatinine, Ser 1.55 (H) 0.44 - 1.00 mg/dL   Calcium 8.9 8.9 - 10.3 mg/dL   GFR calc non Af Amer 33 (L) >60 mL/min   GFR calc Af Amer 38 (L) >60 mL/min   Anion gap 5 5 - 15  CBG monitoring, ED  Result Value Ref Range   Glucose-Capillary 101 (H) 65 - 99 mg/dL   Comment 1 Notify RN   CBG monitoring, ED  Result Value Ref Range   Glucose-Capillary 97 65 - 99 mg/dL  CBG monitoring, ED  Result Value Ref Range   Glucose-Capillary 102 (H) 65 - 99 mg/dL  CBG monitoring, ED  Result Value Ref Range   Glucose-Capillary 86 65 - 99 mg/dL  CBG monitoring, ED  Result Value Ref Range   Glucose-Capillary 155 (H) 65 - 99 mg/dL  CBG monitoring, ED  Result Value Ref Range   Glucose-Capillary 134 (H) 65 - 99 mg/dL   Ct Renal Stone Study  Result Date: 12/15/2018 CLINICAL DATA:  Abdominal distention and pain for 2 weeks. EXAM: CT ABDOMEN AND PELVIS WITHOUT CONTRAST TECHNIQUE: Multidetector CT imaging of the abdomen and pelvis was performed following the  standard protocol without IV contrast. COMPARISON:  Chest CT report 10/19/2000 FINDINGS: Lower chest: Cardiomegaly with right atrial and right ventricular leads identified. No pericardial effusion or thickening. Mild bronchiectasis to the lower lobes with left lower lobe atelectasis. No effusion or pneumothorax. Hepatobiliary: Cirrhotic appearing liver without definite space-occupying mass given limitations of a noncontrast study. Tiny granuloma in the right hepatic lobe. Gallbladder is physiologically distended and contains layering gallstones. No mural thickening is identified. Pancreas: Normal without ductal dilatation Spleen: Normal size spleen without mass. Adrenals/Urinary Tract: Normal bilateral adrenal glands and kidneys  without nephrolithiasis nor hydroureteronephrosis. The urinary bladder is unremarkable. Stomach/Bowel: Stomach is within normal limits. Appendix is not confidently identified. No evidence of bowel wall thickening, distention, or inflammatory changes. Vascular/Lymphatic: Moderate aortoiliac atherosclerosis. Lymphadenopathy. Reproductive: Uterus appears surgically absent. No adnexal mass is seen. Other: Moderate to large volume of ascites with soft tissue anasarca. Musculoskeletal: No acute nor suspicious osseous abnormalities. Mild anterior height loss of L4 without retropulsion, likely chronic. Facet arthrosis is noted of the lumbar spine. IMPRESSION: 1. Moderate to large volume of ascites with soft tissue anasarca. 2. Cirrhotic appearing liver without definite space-occupying mass given limitations of a noncontrast study. 3. Uncomplicated cholelithiasis. 4. Aortoiliac atherosclerosis. Electronically Signed   By: Ashley Royalty M.D.   On: 12/15/2018 00:22    EKG None  Radiology Ct Renal Stone Study  Result Date: 12/15/2018 CLINICAL DATA:  Abdominal distention and pain for 2 weeks. EXAM: CT ABDOMEN AND PELVIS WITHOUT CONTRAST TECHNIQUE: Multidetector CT imaging of the abdomen and pelvis was performed following the standard protocol without IV contrast. COMPARISON:  Chest CT report 10/19/2000 FINDINGS: Lower chest: Cardiomegaly with right atrial and right ventricular leads identified. No pericardial effusion or thickening. Mild bronchiectasis to the lower lobes with left lower lobe atelectasis. No effusion or pneumothorax. Hepatobiliary: Cirrhotic appearing liver without definite space-occupying mass given limitations of a noncontrast study. Tiny granuloma in the right hepatic lobe. Gallbladder is physiologically distended and contains layering gallstones. No mural thickening is identified. Pancreas: Normal without ductal dilatation Spleen: Normal size spleen without mass. Adrenals/Urinary Tract: Normal bilateral  adrenal glands and kidneys without nephrolithiasis nor hydroureteronephrosis. The urinary bladder is unremarkable. Stomach/Bowel: Stomach is within normal limits. Appendix is not confidently identified. No evidence of bowel wall thickening, distention, or inflammatory changes. Vascular/Lymphatic: Moderate aortoiliac atherosclerosis. Lymphadenopathy. Reproductive: Uterus appears surgically absent. No adnexal mass is seen. Other: Moderate to large volume of ascites with soft tissue anasarca. Musculoskeletal: No acute nor suspicious osseous abnormalities. Mild anterior height loss of L4 without retropulsion, likely chronic. Facet arthrosis is noted of the lumbar spine. IMPRESSION: 1. Moderate to large volume of ascites with soft tissue anasarca. 2. Cirrhotic appearing liver without definite space-occupying mass given limitations of a noncontrast study. 3. Uncomplicated cholelithiasis. 4. Aortoiliac atherosclerosis. Electronically Signed   By: Ashley Royalty M.D.   On: 12/15/2018 00:22    Procedures Procedures (including critical care time)  Medications Ordered in ED Medications  fentaNYL (SUBLIMAZE) injection 50 mcg (50 mcg Intravenous Given 12/15/18 0017)      Final Clinical Impressions(s) / ED Diagnoses   Will admit to medicine for pain and Rule out SBP on anti coagulation.      Taylan Mayhan, MD 12/15/18 3428

## 2018-12-15 NOTE — ED Notes (Signed)
ED TO INPATIENT HANDOFF REPORT  Name/Age/Gender Laura Sampson 73 y.o. female  Code Status    Code Status Orders  (From admission, onward)         Start     Ordered   12/15/18 0246  Full code  Continuous     12/15/18 0246        Code Status History    This patient has a current code status but no historical code status.    Advance Directive Documentation     Most Recent Value  Type of Advance Directive  Living will  Pre-existing out of facility DNR order (yellow form or pink MOST form)  -  "MOST" Form in Place?  -      Home/SNF/Other Home  Chief Complaint Abdominal Swelling  Level of Care/Admitting Diagnosis ED Disposition    ED Disposition Condition [facility]: [facility] [[id number]]  Level of Care: Med-Surg [16]  Diagnosis: Ascites [470962]  Admitting Physician: Etta Quill [[id number]]  Attending Physician: Etta Quill [[id number]]  PT Class (Do Not Modify): Observation [104]  PT Acc Code (Do Not Modify): Observation [10022]       Medical History Past Medical History:  Diagnosis Date  . CAD (coronary artery disease)   . Diabetes mellitus without complication ([place])   . DM2 (diabetes mellitus, type 2) ([place])   . HTN (hypertension)   . HTN (hypertension)   . PAF (paroxysmal atrial fibrillation) ([place])   . Portal vein thrombosis   . Renal disorder    "low functioning" no treatment needed at this time    Allergies Allergies  Allergen Reactions  . Penicillins Rash    Has patient had a PCN reaction causing immediate rash, facial/tongue/throat swelling, SOB or lightheadedness with hypotension: Yes Has patient had a PCN reaction causing severe rash involving mucus membranes or skin necrosis: Yes Has patient had a PCN reaction that required hospitalization: No Has patient had a PCN reaction occurring within the last 10 years: Unknown If all of the above answers are "NO", then may proceed with Cephalosporin use.    . Clindamycin/Lincomycin Itching    IV Location/Drains/Wounds Patient Lines/Drains/Airways Status   Active Line/Drains/Airways    Name:   Placement date:   Placement time:   Site:   Days:   Peripheral IV 12/15/18 Right Antecubital   12/15/18    0019    Antecubital   less than 1          Labs/Imaging Results for orders placed or performed during the hospital encounter of 12/14/18 (from the past 48 hour(s))  Lipase, blood     Status: None   Collection Time: 12/14/18 11:13 PM  Result Value Ref Range   Lipase 27 11 - 51 U/L    Comment: Performed at [facility], [place] [address], [place], [place] [address]  CBC with Diff     Status: Abnormal   Collection Time: 12/14/18 11:13 PM  Result Value Ref Range   WBC 4.5 4.0 - 10.5 K/uL   RBC 3.30 (L) 3.87 - 5.11 MIL/uL   Hemoglobin 10.1 (L) 12.0 - 15.0 g/dL   HCT 31.9 (L) 36.0 - 46.0 %   MCV 96.7 80.0 - 100.0 fL   MCH 30.6 26.0 - 34.0 pg   MCHC 31.7 30.0 - 36.0 g/dL   RDW 18.9 (H) 11.5 - 15.5 %   Platelets 134 (L) 150 - 400 K/uL   nRBC 0.0 0.0 -  0.2 %   Neutrophils Relative % 75 %   Neutro Abs 3.3 1.7 - 7.7 K/uL   Lymphocytes Relative 11 %   Lymphs Abs 0.5 (L) 0.7 - 4.0 K/uL   Monocytes Relative 8 %   Monocytes Absolute 0.4 0.1 - 1.0 K/uL   Eosinophils Relative 5 %   Eosinophils Absolute 0.2 0.0 - 0.5 K/uL   Basophils Relative 1 %   Basophils Absolute 0.0 0.0 - 0.1 K/uL   Immature Granulocytes 0 %   Abs Immature Granulocytes 0.01 0.00 - 0.07 K/uL    Comment: Performed at [facility], [place] [address], [place], [place] [address]  Hepatic function panel     Status: Abnormal   Collection Time: 12/14/18 11:13 PM  Result Value Ref Range   Total Protein 8.2 (H) 6.5 - 8.1 g/dL   Albumin 3.0 (L) 3.5 - 5.0 g/dL   AST 210 (H) 15 - 41 U/L   ALT 27 0 - 44 U/L   Alkaline Phosphatase 454 (H) 38 - 126 U/L   Total Bilirubin 0.7 0.3 - 1.2 mg/dL   Bilirubin, Direct 0.2 0.0 - 0.2 mg/dL   Indirect Bilirubin  0.5 0.3 - 0.9 mg/dL    Comment: Performed at [facility], [place] [address] [place], [place] [address]  I-stat chem 8, ed     Status: Abnormal   Collection Time: 12/15/18  2:12 AM  Result Value Ref Range   Sodium 142 135 - 145 mmol/L   Potassium 4.8 3.5 - 5.1 mmol/L   Chloride 117 (H) 98 - 111 mmol/L   BUN 50 (H) 8 - 23 mg/dL   Creatinine, Ser 2.00 (H) 0.44 - 1.00 mg/dL   Glucose, Bld 132 (H) 70 - 99 mg/dL   Calcium, Ion 0.86 (LL) 1.15 - 1.40 mmol/L   TCO2 19 (L) 22 - 32 mmol/L   Hemoglobin 8.5 (L) 12.0 - 15.0 g/dL   HCT 25.0 (L) 36.0 - 46.0 %   Comment NOTIFIED PHYSICIAN    Ct Renal Stone Study  Result Date: 12/15/2018 CLINICAL DATA:  Abdominal distention and pain for 2 weeks. EXAM: CT ABDOMEN AND PELVIS WITHOUT CONTRAST TECHNIQUE: Multidetector CT imaging of the abdomen and pelvis was performed following the standard protocol without IV contrast. COMPARISON:  Chest CT report 10/19/2000 FINDINGS: Lower chest: Cardiomegaly with right atrial and right ventricular leads identified. No pericardial effusion or thickening. Mild bronchiectasis to the lower lobes with left lower lobe atelectasis. No effusion or pneumothorax. Hepatobiliary: Cirrhotic appearing liver without definite space-occupying mass given limitations of a noncontrast study. Tiny granuloma in the right hepatic lobe. Gallbladder is physiologically distended and contains layering gallstones. No mural thickening is identified. Pancreas: Normal without ductal dilatation Spleen: Normal size spleen without mass. Adrenals/Urinary Tract: Normal bilateral adrenal glands and kidneys without nephrolithiasis nor hydroureteronephrosis. The urinary bladder is unremarkable. Stomach/Bowel: Stomach is within normal limits. Appendix is not confidently identified. No evidence of bowel wall thickening, distention, or inflammatory changes. Vascular/Lymphatic: Moderate aortoiliac atherosclerosis. Lymphadenopathy. Reproductive: Uterus  appears surgically absent. No adnexal mass is seen. Other: Moderate to large volume of ascites with soft tissue anasarca. Musculoskeletal: No acute nor suspicious osseous abnormalities. Mild anterior height loss of L4 without retropulsion, likely chronic. Facet arthrosis is noted of the lumbar spine. IMPRESSION: 1. Moderate to large volume of ascites with soft tissue anasarca. 2. Cirrhotic appearing liver without definite space-occupying mass given limitations of a noncontrast study. 3. Uncomplicated cholelithiasis. 4. Aortoiliac atherosclerosis. Electronically Signed  By: Ashley Royalty M.D.   On: 12/15/2018 00:22    Pending Labs Unresulted Labs (From admission, onward)    Start     Ordered   12/16/18 0500  CBC  Daily,   R     12/15/18 0309   12/15/18 1200  Heparin level (unfractionated)  Once-Timed,   R     12/15/18 0326   12/15/18 1200  APTT  Once-Timed,   R     12/15/18 0326   12/15/18 0253  Heparin level (unfractionated)  ONCE - STAT,   R     12/15/18 0253   12/15/18 0253  APTT  ONCE - STAT,   R     12/15/18 0253   12/15/18 0004  Gastrointestinal Panel by PCR , Stool  (Gastrointestinal Panel by PCR, Stool)  Once,   R     12/15/18 0003   12/15/18 0004  C difficile quick scan w PCR reflex  (C Difficile quick screen w PCR reflex panel)  Once, for 24 hours,   R     12/15/18 0003   12/14/18 2313  Urinalysis, Routine w reflex microscopic  ONCE - STAT,   STAT     12/14/18 2313          Vitals/Pain Today's Vitals   12/15/18 0000 12/15/18 0214 12/15/18 0215 12/15/18 0321  BP: 138/79 (!) 146/58    Pulse: (!) 112 70    Resp: (!) 25 17    Temp:      TempSrc:      SpO2: 100% 99%    Weight:    68 kg  Height:    5' 2"  (1.575 m)  PainSc:   7      Isolation Precautions Enteric precautions (UV disinfection)  Medications Medications  carvedilol (COREG) tablet 3.125 mg (has no administration in time range)  calcitRIOL (ROCALTROL) capsule 0.25 mcg (has no administration in time range)   vancomycin (VANCOCIN) 50 mg/mL oral solution 125 mg (has no administration in time range)    Followed by  vancomycin (VANCOCIN) 50 mg/mL oral solution 125 mg (has no administration in time range)    Followed by  vancomycin (VANCOCIN) 50 mg/mL oral solution 125 mg (has no administration in time range)    Followed by  vancomycin (VANCOCIN) 50 mg/mL oral solution 125 mg (has no administration in time range)    Followed by  vancomycin (VANCOCIN) 50 mg/mL oral solution 125 mg (has no administration in time range)  insulin aspart (novoLOG) injection 0-15 Units (has no administration in time range)  insulin NPH Human (HUMULIN N,NOVOLIN N) injection 35 Units (has no administration in time range)  digoxin (LANOXIN) tablet 0.125 mg (has no administration in time range)  gabapentin (NEURONTIN) capsule 100 mg (has no administration in time range)  hydrALAZINE (APRESOLINE) tablet 25 mg (has no administration in time range)  levothyroxine (SYNTHROID, LEVOTHROID) tablet 75 mcg (has no administration in time range)  sodium bicarbonate tablet 650 mg (has no administration in time range)  magnesium oxide (MAG-OX) tablet 400 mg (has no administration in time range)  allopurinol (ZYLOPRIM) tablet 200 mg (has no administration in time range)  acetaminophen (TYLENOL) tablet 650 mg (has no administration in time range)    Or  acetaminophen (TYLENOL) suppository 650 mg (has no administration in time range)  ondansetron (ZOFRAN) tablet 4 mg (has no administration in time range)    Or  ondansetron (ZOFRAN) injection 4 mg (has no administration in time range)  fentaNYL (SUBLIMAZE) injection 25-50  mcg (has no administration in time range)  heparin ADULT infusion 100 units/mL (25000 units/268m sodium chloride 0.45%) (has no administration in time range)  fentaNYL (SUBLIMAZE) injection 50 mcg (50 mcg Intravenous Given 12/15/18 0017)    Mobility walks with person assist

## 2018-12-15 NOTE — Progress Notes (Signed)
ANTICOAGULATION CONSULT NOTE  Pharmacy Consult for heparin Indication: hx atrial fibrillation, portal vein thrombosis (home Eliquis on hold)  Patient Measurements: weight 68 kg, height 62 inches Height: 5' 2"  (157.5 cm) Weight: 150 lb (68 kg) IBW/kg (Calculated) : 50.1 Heparin Dosing Weight: 64 kg  Vital Signs: Temp: 97.8 F (36.6 C) (01/09 0636) Temp Source: Oral (01/09 0636) BP: 118/59 (01/09 0636) Pulse Rate: 70 (01/09 0636)  Labs: Recent Labs    12/14/18 2313 12/15/18 0212 12/15/18 0253 12/15/18 1341  HGB 10.1* 8.5*  --   --   HCT 31.9* 25.0*  --   --   PLT 134*  --   --   --   APTT  --   --  34 59*  HEPARINUNFRC  --   --  1.46* 0.88*  CREATININE  --  2.00*  --   --     Estimated Creatinine Clearance: 23 mL/min (A) (by C-G formula based on SCr of 2 mg/dL (H)).   Medications:  - on eliquis 5 mg bid PTA (last dose 12/14/18 at 0900)  Assessment: Patient's a 73 y.o F with hx multiple myeloma, SBP, Afib, and portal vein thrombosis on Eliquis PTA, presented to the ED on 12/15/18 with c/o abd pain.  CT on 12/15/18 showed moderate to large volume of ascites.  To transition anticoagulant from Eliquis to heparin drip in anticipation for paracentesis. First aPTT below goal at 59 seconds after heparin drip started with no bolus at rate of 950 units/hr. Heparin level 0.88 reflects recent eliquis dose.  Hg 10.1 at 2313 and 8.5 drawn 3 hrs later.   Goal of Therapy:  Heparin level 0.3-0.7 units/ml Monitor platelets by anticoagulation protocol: Yes  aPTT goal 66-102 seconds   Plan:  - increae heparin drip to 1100 units/hr - check 8 hr heparin level and aPTT - monitor for s/s bleeding - please advise pharmacy if/when heparin drip needs to be held for procedure  Eudelia Bunch, Pharm.D [phone] 12/15/2018 2:28 PM

## 2018-12-15 NOTE — Procedures (Signed)
PROCEDURE SUMMARY:  Successful image-guided paracentesis from the right lower abdomen.  Yielded 3.2 liters of hazy amber fluid.  No immediate complications.  Patient tolerated well.   Specimen was sent for labs.  Claris Pong Louk PA-C 12/15/2018 3:25 PM

## 2018-12-15 NOTE — Progress Notes (Addendum)
[place] for heparin Indication: hx atrial fibrillation, portal vein thrombosis (home Eliquis on hold)  Patient Measurements: weight 68 kg, height 62 inches   Heparin Dosing Weight: 64 kg  Vital Signs: Temp: 98.4 F (36.9 C) (01/08 2225) Temp Source: Oral (01/08 2225) BP: 146/58 (01/09 0214) Pulse Rate: 70 (01/09 0214)  Labs: Recent Labs    12/14/18 2313 12/15/18 0212  HGB 10.1* 8.5*  HCT 31.9* 25.0*  PLT 134*  --   CREATININE  --  2.00*    CrCl cannot be calculated (Unknown ideal weight.).   Medications:  - on eliquis 5 mg bid PTA (last dose 12/14/18 at 0900)  Assessment: Patient's a 73 y.o F with hx multiple myeloma, SBP, Afib, and portal vein thrombosis on Eliquis PTA, presented to the ED on 12/15/18 with c/o abd pain.  CT on 12/15/18 showed moderate to large volume of ascites.  To transition anticoagulant from Eliquis to heparin drip in anticipation for paracentesis.   Goal of Therapy:  Heparin level 0.3-0.7 units/ml Monitor platelets by anticoagulation protocol: Yes   Plan:  - check baseline aPTT and heparin level now - heparin drip at 950 units/hr - check 8 hr heparin level and aPTT - monitor for s/s bleeding - please advise pharmacy if/when heparin drip needs to be held for procedure   Paul Torpey P 12/15/2018,2:44 AM

## 2018-12-15 NOTE — ED Notes (Signed)
Pls.  bring patient [phone]

## 2018-12-15 NOTE — H&P (Signed)
History and Physical    Laura Sampson TML:[id number] DOB: [birth year] DOA: 12/14/2018  PCP: System, Pcp Not In  Patient coming from: Home  I have personally briefly reviewed patient's old medical records in [place]  Chief Complaint: Abd pain, diarrhea  HPI: Laura Sampson is a 73 y.o. female with medical history significant of DM2, HTN, CAD s/p CABG, PPM.  Recent diagnosis of MM.  Patient recently hospitalized in OSH at end of December for abd pain, swelling, diarrhea.  Work up during hospital stay showed cirrhotic appearance of liver, ascites, though not enough ascites apparently to tap when they tried.  Stool came back positive for C.Diff.  Patient was started on PO vanc, bumex, and aldactone.  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  Also saw a 2.3cm right lobe hepatic mass on [place] 12/21, though the same mass couldn't be visualized on repeat US x5 days later on the 26th.   ED Course: ALK 454, Creat 2.0 AST 210 ALT 27 (about what they had been running at OSH).  CT abd/pelvis shows: Moderate to large volume of ascites with soft tissue anasarca.   Review of Systems: As per HPI otherwise 10 point review of systems negative.   Past Medical History:  Diagnosis Date  . CAD (coronary artery disease)   . Diabetes mellitus without complication ([place])   . DM2 (diabetes mellitus, type 2) ([place])   . HTN (hypertension)   . HTN (hypertension)   . PAF (paroxysmal atrial fibrillation) ([place])   . Portal vein thrombosis   . Renal disorder    "low functioning" no treatment needed at this time    Past Surgical History:  Procedure Laterality Date  . COLONOSCOPY    . CORONARY ARTERY BYPASS GRAFT    . ESOPHAGOGASTRODUODENOSCOPY    . NASAL SINUS SURGERY    . PACEMAKER IMPLANT  2012   [employer]  . PARTIAL HYSTERECTOMY       reports that she quit smoking about 37 years ago. She quit after 4.00 years of use. She has never used smokeless tobacco. She reports that she does  not drink alcohol or use drugs.  Allergies  Allergen Reactions  . Penicillins Rash    Has patient had a PCN reaction causing immediate rash, facial/tongue/throat swelling, SOB or lightheadedness with hypotension: Yes Has patient had a PCN reaction causing severe rash involving mucus membranes or skin necrosis: Yes Has patient had a PCN reaction that required hospitalization: No Has patient had a PCN reaction occurring within the last 10 years: Unknown If all of the above answers are "NO", then may proceed with Cephalosporin use.   . Clindamycin/Lincomycin Itching    Family History  Problem Relation Age of Onset  . Diabetes Mother   . Breast cancer Mother   . Diabetes Father   . Heart disease Father   . Diabetes Sister      Prior to Admission medications   Medication Sig Start Date End Date Taking? Authorizing Provider  acetaminophen (TYLENOL) 500 MG tablet Take 1,000 mg by mouth every 6 (six) hours as needed for mild pain, moderate pain, fever or headache.   Yes [provider]  allopurinol (ZYLOPRIM) 100 MG tablet Take 200 mg by mouth daily.   Yes [provider]  apixaban (ELIQUIS) 5 MG TABS tablet Take 5 mg by mouth 2 (two) times daily.   Yes [provider]  bumetanide (BUMEX) 0.5 MG tablet Take 0.5 mg by mouth daily.  Yes [provider]  calcitRIOL (ROCALTROL) 0.25 MCG capsule Take 0.25 mcg by mouth daily.   Yes [provider]  carvedilol (COREG) 3.125 MG tablet Take 3.125 mg by mouth 2 (two) times daily with a meal.   Yes [provider]  digoxin (LANOXIN) 0.125 MG tablet Take 0.125 mg by mouth daily.    Yes [provider]  gabapentin (NEURONTIN) 100 MG capsule Take 100 mg by mouth 2 (two) times daily.   Yes [provider]  hydrALAZINE (APRESOLINE) 25 MG tablet Take 25 mg by mouth 3 (three) times daily.    Yes [provider]  insulin lispro (HUMALOG) 100 UNIT/ML injection Inject 0.16 mLs  (16 Units total) into the skin 3 (three) times daily before meals. Patient taking differently: Inject 8 Units into the skin 3 (three) times daily before meals.  08/23/17  Yes Maczis, Barth Kirks, PA-C  insulin NPH Human (HUMULIN N,NOVOLIN N) 100 UNIT/ML injection Inject 45 Units into the skin 2 (two) times daily. 12/04/18 02/09/19 Yes [provider]  levothyroxine (SYNTHROID, LEVOTHROID) 75 MCG tablet Take 75 mcg by mouth daily before breakfast.   Yes [provider]  magnesium oxide (MAG-OX) 400 MG tablet Take 400 mg by mouth daily.   Yes [provider]  sodium bicarbonate 650 MG tablet Take 650 mg by mouth 2 (two) times daily.   Yes [provider]  spironolactone (ALDACTONE) 25 MG tablet Take 25 mg by mouth daily.   Yes [provider]    Physical Exam: Vitals:   12/14/18 2300 12/15/18 0000 12/15/18 0214 12/15/18 0321  BP: 140/71 138/79 (!) 146/58   Pulse: 69 (!) 112 70   Resp: (!) 27 (!) 25 17   Temp:      TempSrc:      SpO2: 100% 100% 99%   Weight:    68 kg  Height:    5' 2"  (1.575 m)    Constitutional: NAD, calm, comfortable Eyes: PERRL, lids and conjunctivae normal ENMT: Mucous membranes are moist. Posterior pharynx clear of any exudate or lesions.Normal dentition.  Neck: normal, supple, no masses, no thyromegaly Respiratory: clear to auscultation bilaterally, no wheezing, no crackles. Normal respiratory effort. No accessory muscle use.  Cardiovascular: Regular rate and rhythm, no murmurs / rubs / gallops. No extremity edema. 2+ pedal pulses. No carotid bruits.  Abdomen: Distended with fluid wave, generalized TTP without guarding or rebound. Musculoskeletal: no clubbing / cyanosis. No joint deformity upper and lower extremities. Good ROM, no contractures. Normal muscle tone.  Skin: no rashes, lesions, ulcers. No induration Neurologic: CN 2-12 grossly intact. Sensation intact, DTR normal. Strength 5/5 in all 4.  Psychiatric: Normal  judgment and insight. Alert and oriented x 3. Normal mood.    Labs on Admission: I have personally reviewed following labs and imaging studies  CBC: Recent Labs  Lab 12/14/18 2313 12/15/18 0212  WBC 4.5  --   NEUTROABS 3.3  --   HGB 10.1* 8.5*  HCT 31.9* 25.0*  MCV 96.7  --   PLT 134*  --    Basic Metabolic Panel: Recent Labs  Lab 12/15/18 0212  NA 142  K 4.8  CL 117*  GLUCOSE 132*  BUN 50*  CREATININE 2.00*   GFR: Estimated Creatinine Clearance: 23 mL/min (A) (by C-G formula based on SCr of 2 mg/dL (H)). Liver Function Tests: Recent Labs  Lab 12/14/18 2313  AST 210*  ALT 27  ALKPHOS 454*  BILITOT 0.7  PROT 8.2*  ALBUMIN 3.0*   Recent Labs  Lab 12/14/18 2313  LIPASE 27   No results for input(s): AMMONIA in the last 168 hours. Coagulation Profile: No results for input(s): INR, PROTIME in the last 168 hours. Cardiac Enzymes: No results for input(s): CKTOTAL, CKMB, CKMBINDEX, TROPONINI in the last 168 hours. BNP (last 3 results) No results for input(s): PROBNP in the last 8760 hours. HbA1C: No results for input(s): HGBA1C in the last 72 hours. CBG: No results for input(s): GLUCAP in the last 168 hours. Lipid Profile: No results for input(s): CHOL, HDL, LDLCALC, TRIG, CHOLHDL, LDLDIRECT in the last 72 hours. Thyroid Function Tests: No results for input(s): TSH, T4TOTAL, FREET4, T3FREE, THYROIDAB in the last 72 hours. Anemia Panel: No results for input(s): VITAMINB12, FOLATE, FERRITIN, TIBC, IRON, RETICCTPCT in the last 72 hours. Urine analysis:    Component Value Date/Time   COLORURINE STRAW (A) 11/30/2017 0550   APPEARANCEUR CLEAR 11/30/2017 0550   LABSPEC 1.008 11/30/2017 0550   PHURINE 5.0 11/30/2017 0550   GLUCOSEU NEGATIVE 11/30/2017 0550   HGBUR SMALL (A) 11/30/2017 0550   BILIRUBINUR NEGATIVE 11/30/2017 0550   KETONESUR NEGATIVE 11/30/2017 0550   PROTEINUR NEGATIVE 11/30/2017 0550   NITRITE NEGATIVE 11/30/2017 0550   LEUKOCYTESUR NEGATIVE  11/30/2017 0550    Radiological Exams on Admission: Ct Renal Stone Study  Result Date: 12/15/2018 CLINICAL DATA:  Abdominal distention and pain for 2 weeks. EXAM: CT ABDOMEN AND PELVIS WITHOUT CONTRAST TECHNIQUE: Multidetector CT imaging of the abdomen and pelvis was performed following the standard protocol without IV contrast. COMPARISON:  Chest CT report 10/19/2000 FINDINGS: Lower chest: Cardiomegaly with right atrial and right ventricular leads identified. No pericardial effusion or thickening. Mild bronchiectasis to the lower lobes with left lower lobe atelectasis. No effusion or pneumothorax. Hepatobiliary: Cirrhotic appearing liver without definite space-occupying mass given limitations of a noncontrast study. Tiny granuloma in the right hepatic lobe. Gallbladder is physiologically distended and contains layering gallstones. No mural thickening is identified. Pancreas: Normal without ductal dilatation Spleen: Normal size spleen without mass. Adrenals/Urinary Tract: Normal bilateral adrenal glands and kidneys without nephrolithiasis nor hydroureteronephrosis. The urinary bladder is unremarkable. Stomach/Bowel: Stomach is within normal limits. Appendix is not confidently identified. No evidence of bowel wall thickening, distention, or inflammatory changes. Vascular/Lymphatic: Moderate aortoiliac atherosclerosis. Lymphadenopathy. Reproductive: Uterus appears surgically absent. No adnexal mass is seen. Other: Moderate to large volume of ascites with soft tissue anasarca. Musculoskeletal: No acute nor suspicious osseous abnormalities. Mild anterior height loss of L4 without retropulsion, likely chronic. Facet arthrosis is noted of the lumbar spine. IMPRESSION: 1. Moderate to large volume of ascites with soft tissue anasarca. 2. Cirrhotic appearing liver without definite space-occupying mass given limitations of a noncontrast study. 3. Uncomplicated cholelithiasis. 4. Aortoiliac atherosclerosis. Electronically  Signed   By: Ashley Royalty M.D.   On: 12/15/2018 00:22    EKG: Independently reviewed.  Assessment/Plan Principal Problem:   Clostridium difficile diarrhea Active Problems:   Myeloma ([place])   Liver mass   Cirrhosis ([facility])   Ascites   DM2 (diabetes mellitus, type 2) ([facility])   HTN (hypertension)   PAF (paroxysmal atrial fibrillation) ([facility])   CKD (chronic kidney disease) stage 4, GFR 15-29 ml/min ([place])    1. Diarrhea - 1. Diagnosed as C.Diff diarrhea at OSH 2. Will restart PO vanc for the moment 3. However, seems odd that there was no change at all in symptoms despite 8 days of PO vanc (not even temporary improvement). 4. GI pathogen pnl pending 5. Holding  Bumex, Spironolactone for the moment 6. Consider ID consult after above back 2. Abd pain, ascites - 1. SBP seems less likely given 5 week h/o symptoms, no fever, WBC, peritoneal symptoms 2. Will re-attempt paracentesis they wernt able to obtain at OSH x2 weeks ago 3. Urinalysis pending 3. Cirrhosis - needs GI fu 4. ? Liver mass - 1. 2.3cm R lobe, concerning for neoplasm, seen on RUQ [place] on 12/21, repeat US to take a better look at the mass x5 days later and they couldn't find it 2. Plan GI eval / follow up 3. Cant MRI patient due to PPM 5. Myeloma - recent diagnosis needs oncology f/u 6. PAF - 1. Continue coreg and digoxin 2. Holding eliquis, heparin gtt instead, plan for US paracentesis 7. HTN - continue home BP meds 8. DM2 - 1. Insulin NPH 35u BID while in hospital (on 45 BID at home) 2. Mod scale SSI AC 9. CKD stage 4 - 1. Creat 2.0 today appears baseline 2. Continue PO bicarb  DVT prophylaxis: heparin gtt - using in place of eliquis for the moment while IR tries to perform paracentesis Code Status: Full Family Communication: Family at bedside Disposition Plan: Home after admit Consults called: None Admission status: Place in obs - convert to IP if not discharged tomorrow (seems very likely that she will need to be  converted to IP)   [place], [place] [facility] Pager [phone] Only works nights!  If 7AM-7PM, please contact the primary day team physician taking care of patient  [email or web address] Password [email or web address]  12/15/2018, 3:26 AM

## 2018-12-16 ENCOUNTER — Observation Stay (HOSPITAL_COMMUNITY): Payer: Medicare Other

## 2018-12-16 DIAGNOSIS — K7460 Unspecified cirrhosis of liver: Secondary | ICD-10-CM | POA: Diagnosis present

## 2018-12-16 DIAGNOSIS — D62 Acute posthemorrhagic anemia: Secondary | ICD-10-CM | POA: Diagnosis not present

## 2018-12-16 DIAGNOSIS — K8020 Calculus of gallbladder without cholecystitis without obstruction: Secondary | ICD-10-CM | POA: Diagnosis present

## 2018-12-16 DIAGNOSIS — E11649 Type 2 diabetes mellitus with hypoglycemia without coma: Secondary | ICD-10-CM | POA: Diagnosis not present

## 2018-12-16 DIAGNOSIS — E1122 Type 2 diabetes mellitus with diabetic chronic kidney disease: Secondary | ICD-10-CM | POA: Diagnosis present

## 2018-12-16 DIAGNOSIS — Z8619 Personal history of other infectious and parasitic diseases: Secondary | ICD-10-CM | POA: Diagnosis not present

## 2018-12-16 DIAGNOSIS — M549 Dorsalgia, unspecified: Secondary | ICD-10-CM | POA: Diagnosis not present

## 2018-12-16 DIAGNOSIS — R1084 Generalized abdominal pain: Secondary | ICD-10-CM | POA: Diagnosis present

## 2018-12-16 DIAGNOSIS — D631 Anemia in chronic kidney disease: Secondary | ICD-10-CM | POA: Diagnosis present

## 2018-12-16 DIAGNOSIS — I700 Atherosclerosis of aorta: Secondary | ICD-10-CM | POA: Diagnosis present

## 2018-12-16 DIAGNOSIS — I370 Nonrheumatic pulmonary valve stenosis: Secondary | ICD-10-CM | POA: Diagnosis not present

## 2018-12-16 DIAGNOSIS — R180 Malignant ascites: Secondary | ICD-10-CM

## 2018-12-16 DIAGNOSIS — I129 Hypertensive chronic kidney disease with stage 1 through stage 4 chronic kidney disease, or unspecified chronic kidney disease: Secondary | ICD-10-CM | POA: Diagnosis present

## 2018-12-16 DIAGNOSIS — I480 Paroxysmal atrial fibrillation: Secondary | ICD-10-CM | POA: Diagnosis present

## 2018-12-16 DIAGNOSIS — R0789 Other chest pain: Secondary | ICD-10-CM | POA: Diagnosis not present

## 2018-12-16 DIAGNOSIS — E872 Acidosis: Secondary | ICD-10-CM | POA: Diagnosis not present

## 2018-12-16 DIAGNOSIS — I071 Rheumatic tricuspid insufficiency: Secondary | ICD-10-CM | POA: Diagnosis present

## 2018-12-16 DIAGNOSIS — R197 Diarrhea, unspecified: Secondary | ICD-10-CM | POA: Diagnosis present

## 2018-12-16 DIAGNOSIS — K7581 Nonalcoholic steatohepatitis (NASH): Secondary | ICD-10-CM | POA: Diagnosis present

## 2018-12-16 DIAGNOSIS — C9000 Multiple myeloma not having achieved remission: Secondary | ICD-10-CM | POA: Diagnosis present

## 2018-12-16 DIAGNOSIS — N179 Acute kidney failure, unspecified: Secondary | ICD-10-CM | POA: Diagnosis not present

## 2018-12-16 DIAGNOSIS — Z87891 Personal history of nicotine dependence: Secondary | ICD-10-CM | POA: Diagnosis not present

## 2018-12-16 DIAGNOSIS — R188 Other ascites: Secondary | ICD-10-CM | POA: Diagnosis present

## 2018-12-16 DIAGNOSIS — N184 Chronic kidney disease, stage 4 (severe): Secondary | ICD-10-CM | POA: Diagnosis present

## 2018-12-16 DIAGNOSIS — I2510 Atherosclerotic heart disease of native coronary artery without angina pectoris: Secondary | ICD-10-CM | POA: Diagnosis present

## 2018-12-16 DIAGNOSIS — E875 Hyperkalemia: Secondary | ICD-10-CM | POA: Diagnosis not present

## 2018-12-16 DIAGNOSIS — I340 Nonrheumatic mitral (valve) insufficiency: Secondary | ICD-10-CM | POA: Diagnosis not present

## 2018-12-16 DIAGNOSIS — R040 Epistaxis: Secondary | ICD-10-CM | POA: Diagnosis not present

## 2018-12-16 DIAGNOSIS — I81 Portal vein thrombosis: Secondary | ICD-10-CM | POA: Diagnosis present

## 2018-12-16 DIAGNOSIS — A0472 Enterocolitis due to Clostridium difficile, not specified as recurrent: Secondary | ICD-10-CM | POA: Diagnosis not present

## 2018-12-16 LAB — GASTROINTESTINAL PANEL BY PCR, STOOL (REPLACES STOOL CULTURE)

## 2018-12-16 LAB — GRAM STAIN

## 2018-12-16 LAB — URINALYSIS, ROUTINE W REFLEX MICROSCOPIC
Bilirubin Urine: NEGATIVE
Glucose, UA: NEGATIVE mg/dL
Hgb urine dipstick: NEGATIVE
Ketones, ur: NEGATIVE mg/dL
Leukocytes, UA: NEGATIVE
Nitrite: NEGATIVE
PH: 5 (ref 5.0–8.0)
Protein, ur: NEGATIVE mg/dL
Specific Gravity, Urine: 1.014 (ref 1.005–1.030)

## 2018-12-16 LAB — HEPATITIS PANEL, ACUTE
HCV Ab: <0.1 s/co ratio (ref 0.0–0.9)
HEP B S AG: NEGATIVE
Hep A IgM: NEGATIVE
Hep B C IgM: NEGATIVE

## 2018-12-16 LAB — GLUCOSE, CAPILLARY
Glucose-Capillary: 34 mg/dL — CL (ref 70–99)
Glucose-Capillary: 80 mg/dL (ref 70–99)
Glucose-Capillary: 123 mg/dL — ABNORMAL HIGH (ref 70–99)
Glucose-Capillary: 149 mg/dL — ABNORMAL HIGH (ref 70–99)
Glucose-Capillary: 135 mg/dL — ABNORMAL HIGH (ref 70–99)
Glucose-Capillary: 171 mg/dL — ABNORMAL HIGH (ref 70–99)

## 2018-12-16 LAB — CBC
HCT: 30.2 % — ABNORMAL LOW (ref 36.0–46.0)
HCT: 28.1 % — ABNORMAL LOW (ref 36.0–46.0)
Hemoglobin: 9.5 g/dL — ABNORMAL LOW (ref 12.0–15.0)
Hemoglobin: 8.8 g/dL — ABNORMAL LOW (ref 12.0–15.0)
MCH: 30.1 pg (ref 26.0–34.0)
MCH: 29.6 pg (ref 26.0–34.0)
MCHC: 31.5 g/dL (ref 30.0–36.0)
MCHC: 31.3 g/dL (ref 30.0–36.0)
MCV: 95.6 fL (ref 80.0–100.0)
MCV: 94.6 fL (ref 80.0–100.0)
Platelets: 125 10*3/uL — ABNORMAL LOW (ref 150–400)
Platelets: 120 10*3/uL — ABNORMAL LOW (ref 150–400)
RBC: 3.16 MIL/uL — ABNORMAL LOW (ref 3.87–5.11)
RBC: 2.97 MIL/uL — ABNORMAL LOW (ref 3.87–5.11)
RDW: 18.5 % — ABNORMAL HIGH (ref 11.5–15.5)
RDW: 18.6 % — AB (ref 11.5–15.5)
WBC: 3.4 10*3/uL — ABNORMAL LOW (ref 4.0–10.5)
WBC: 3.2 10*3/uL — ABNORMAL LOW (ref 4.0–10.5)
nRBC: 0 % (ref 0.0–0.2)
nRBC: 0 % (ref 0.0–0.2)

## 2018-12-16 LAB — COMPREHENSIVE METABOLIC PANEL
ALT: 21 U/L (ref 0–44)
AST: 176 U/L — ABNORMAL HIGH (ref 15–41)
Albumin: 2.4 g/dL — ABNORMAL LOW (ref 3.5–5.0)
Alkaline Phosphatase: 359 U/L — ABNORMAL HIGH (ref 38–126)
Anion gap: 7 (ref 5–15)
BILIRUBIN TOTAL: 0.6 mg/dL (ref 0.3–1.2)
BUN: 43 mg/dL — ABNORMAL HIGH (ref 8–23)
CO2: 22 mmol/L (ref 22–32)
Calcium: 8.5 mg/dL — ABNORMAL LOW (ref 8.9–10.3)
Chloride: 110 mmol/L (ref 98–111)
Creatinine, Ser: 2.56 mg/dL — ABNORMAL HIGH (ref 0.44–1.00)
GFR calc Af Amer: 21 mL/min — ABNORMAL LOW (ref >60)
GFR calc non Af Amer: 18 mL/min — ABNORMAL LOW (ref >60)
Glucose, Bld: 106 mg/dL — ABNORMAL HIGH (ref 70–99)
Potassium: 4.3 mmol/L (ref 3.5–5.1)
Sodium: 139 mmol/L (ref 135–145)
Total Protein: 6.9 g/dL (ref 6.5–8.1)

## 2018-12-16 LAB — PH, BODY FLUID: pH, Body Fluid: 7.5

## 2018-12-16 LAB — HEPARIN LEVEL (UNFRACTIONATED)
HEPARIN UNFRACTIONATED: 0.61 [IU]/mL (ref 0.30–0.70)
Heparin Unfractionated: 0.84 IU/mL — ABNORMAL HIGH (ref 0.30–0.70)
Heparin Unfractionated: 0.9 IU/mL — ABNORMAL HIGH (ref 0.30–0.70)

## 2018-12-16 LAB — HEMOGLOBIN A1C
Hgb A1c MFr Bld: 8.7 % — ABNORMAL HIGH (ref 4.8–5.6)
Mean Plasma Glucose: 202.99 mg/dL

## 2018-12-16 LAB — MAGNESIUM: Magnesium: 2.1 mg/dL (ref 1.7–2.4)

## 2018-12-16 LAB — APTT: APTT: 135 s — AB (ref 24–36)

## 2018-12-16 LAB — CREATININE, URINE, RANDOM: Creatinine, Urine: 155.02 mg/dL

## 2018-12-16 LAB — SODIUM, URINE, RANDOM: Sodium, Ur: 57 mmol/L

## 2018-12-16 MED ORDER — SODIUM CHLORIDE 0.9 % IV SOLN
INTRAVENOUS | Status: DC
  Administered 2018-12-16 – 2018-12-17 (×2): via INTRAVENOUS

## 2018-12-16 MED ORDER — HEPARIN (PORCINE) 25000 UT/250ML-% IV SOLN
1000.0000 [IU]/h | INTRAVENOUS | Status: DC
  Administered 2018-12-16 (×2): 1000 [IU]/h via INTRAVENOUS
  Filled 2018-12-16 (×2): qty 250

## 2018-12-16 MED ORDER — INSULIN ASPART 100 UNIT/ML ~~LOC~~ SOLN
0.0000 [IU] | Freq: Three times a day (TID) | SUBCUTANEOUS | Status: DC
  Administered 2018-12-16 (×2): 1 [IU] via SUBCUTANEOUS
  Administered 2018-12-17 (×3): 2 [IU] via SUBCUTANEOUS
  Administered 2018-12-18: 1 [IU] via SUBCUTANEOUS
  Administered 2018-12-18 – 2018-12-19 (×3): 2 [IU] via SUBCUTANEOUS
  Administered 2018-12-19: 5 [IU] via SUBCUTANEOUS
  Administered 2018-12-19 – 2018-12-20 (×2): 3 [IU] via SUBCUTANEOUS
  Administered 2018-12-20: 2 [IU] via SUBCUTANEOUS
  Administered 2018-12-20 – 2018-12-21 (×3): 3 [IU] via SUBCUTANEOUS
  Administered 2018-12-22: 2 [IU] via SUBCUTANEOUS

## 2018-12-16 MED ORDER — HEPARIN (PORCINE) 25000 UT/250ML-% IV SOLN
900.0000 [IU]/h | INTRAVENOUS | Status: DC
  Filled 2018-12-16: qty 250

## 2018-12-16 MED ORDER — ALBUMIN HUMAN 25 % IV SOLN
25.0000 g | Freq: Four times a day (QID) | INTRAVENOUS | Status: AC
  Administered 2018-12-16 – 2018-12-17 (×4): 25 g via INTRAVENOUS
  Filled 2018-12-16 (×5): qty 100

## 2018-12-16 MED ORDER — OXYMETAZOLINE HCL 0.05 % NA SOLN
2.0000 | Freq: Two times a day (BID) | NASAL | Status: AC
  Administered 2018-12-16 – 2018-12-17 (×2): 2 via NASAL
  Filled 2018-12-16: qty 15

## 2018-12-16 NOTE — Progress Notes (Signed)
Hypoglycemic Event  CBG:34 @0432   Treatment: two cups of orange juice with couple of packet of sugar given  Symptoms: dizziness   Follow-up CBG: Time 0503 CBG -80  Possible Reasons for Event:may be  insuline at bed time Comments/MD notified: paged on call  X. [place], No any new order received.    Karlin Binion D Levette Paulick

## 2018-12-16 NOTE — Progress Notes (Signed)
Active w/[facility] rep Ronalee Belts aware-HHRN/HHPT.

## 2018-12-16 NOTE — Progress Notes (Signed)
ANTICOAGULATION CONSULT NOTE  Pharmacy Consult for heparin Indication: hx atrial fibrillation, portal vein thrombosis (home Eliquis on hold)  Patient Measurements: weight 68 kg, height 62 inches Height: 5' 2"  (157.5 cm) Weight: 150 lb (68 kg) IBW/kg (Calculated) : 50.1 Heparin Dosing Weight: 64 kg  Vital Signs: Temp: 98.6 F (37 C) (01/10 2134) Temp Source: Oral (01/10 2134) BP: 123/58 (01/10 2134) Pulse Rate: 82 (01/10 2134)  Labs: Recent Labs    12/14/18 2313 12/15/18 0212  12/15/18 0253 12/15/18 1341  12/16/18 0147 12/16/18 1218 12/16/18 2111  HGB 10.1* 8.5*  --   --   --   --  9.5*  --   --   HCT 31.9* 25.0*  --   --   --   --  30.2*  --   --   PLT 134*  --   --   --   --   --  125*  --   --   APTT  --   --   --  34 59*  --  135*  --   --   HEPARINUNFRC  --   --    < > 1.46* 0.88*   < > 0.84* 0.90* 0.61  CREATININE  --  2.00*  --   --   --   --  2.56*  --   --    < > = values in this interval not displayed.    Estimated Creatinine Clearance: 18 mL/min (A) (by C-G formula based on SCr of 2.56 mg/dL (H)).   Medications:  - on eliquis 5 mg bid PTA (last dose 12/14/18 at 0900)  Assessment: Patient's a 73 y.o F with hx multiple myeloma, SBP, Afib, and portal vein thrombosis on Eliquis PTA, presented to the ED on 12/15/18 with c/o abd pain.  CT on 12/15/18 showed moderate to large volume of ascites.  To transition anticoagulant from Eliquis to heparin drip in anticipation for paracentesis.  12/16/2018   Most recent heparin level therapeutic on 900 units/hr  CBC stable  No documented bleeding  Goal of Therapy:  Heparin level 0.3-0.7 units/ml Monitor platelets by anticoagulation protocol: Yes  aPTT goal 66-102 seconds   Plan:  - Continue heparin drip at 900 units/hr - recheck HL with AM labs - Daily CBC and HL - monitor for s/s bleeding - f/u to resume home [place], PharmD, BCPS [phone] 12/16/2018, 10:17 PM

## 2018-12-16 NOTE — Care Management Obs Status (Signed)
[facility] NOTIFICATION   Patient Details  Name: Laura Sampson MRN: [id number] Date of Birth: [birth year]   Medicare Observation Status Notification Given:  Yes    MahabirJuliann Pulse, RN 12/16/2018, 4:27 PM

## 2018-12-16 NOTE — Progress Notes (Signed)
PROGRESS NOTE    Laura Sampson  MVH:[id number] DOB: 1946-05-07 DOA: 12/14/2018 PCP: System, Pcp Not In    Brief Narrative:  Per [name] is a 73 y.o. female with medical history significant of DM2, HTN, CAD s/p CABG, PPM.  Recent diagnosis of MM.  Patient recently hospitalized in OSH at end of December for abd pain, swelling, diarrhea.  Work up during hospital stay showed cirrhotic appearance of liver, ascites, though not enough ascites apparently to tap when they tried.  Stool came back positive for C.Diff.  Patient was started on PO vanc, bumex, and aldactone.  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  Also saw a 2.3cm right lobe hepatic mass on [place] 12/21, though the same mass couldn't be visualized on repeat US x5 days later on the 26th.   ED Course: ALK 454, Creat 2.0 AST 210 ALT 27 (about what they had been running at OSH).  CT abd/pelvis shows: Moderate to large volume of ascites with soft tissue anasarca.    Assessment & Plan:   Principal Problem:   Clostridium difficile diarrhea Active Problems:   Myeloma ([place])   Liver mass   Cirrhosis (HCC)   Ascites   DM2 (diabetes mellitus, type 2) (HCC)   HTN (hypertension)   PAF (paroxysmal atrial fibrillation) (HCC)   CKD (chronic kidney disease) stage 4, GFR 15-29 ml/min (HCC)  1 diarrhea/recent diagnosis of C. difficile colitis at outside hospital Patient noted to have recently been diagnosed with a C. difficile colitis at outside hospital.  Patient still with complaints of multiple loose stools.  C. difficile PCR was repeated which was negative.  Pharmacy noted that patient has 12/15/2018 had had a full 14-day course of treatment for C. difficile colitis.  Oral vancomycin has been discontinued.  Bumex and spironolactone on hold.  GI pathogen panel negative.  Imodium as needed.  Follow.  2.  Cirrhosis with ascites/abdominal pain Patient status post ultrasound-guided paracentesis with 3.2 L  of hazy fluid removed.  Gram stain and cultures negative to date.  Doubt if patient has SBP.  Patient with some improvement with moderate abdominal discomfort post paracentesis.  Urinalysis nitrite negative, leukocytes negative, protein negative.  Bumex and spironolactone on hold due to probable dehydration from multiple episodes of diarrhea.  Slight bump in creatinine.  Acute hepatitis panel is negative.  Place on IV albumin every 6 hours x1 day.  Monitor.  Place on a PPI.  Will need outpatient follow-up with gastroenterology.  3.  Diabetes mellitus type 2 Patient noted to have a hypoglycemic episode this morning with a CBG of 34 which improved after orange juice and packets of sugar.  Hemoglobin A1c 8.7.  Will discontinue 70/30.  Continue sliding scale insulin.  4.  Chronic kidney disease stage IV Baseline creatinine approximately 2.  Creatinine currently at 2.56.  Urinalysis negative for proteinuria.  Patient with slight bump in creatinine may be secondary to prerenal azotemia from multiple episodes of diarrhea.  Continue to hold diuretics of Bumex and spironolactone.  Continue calcitriol.  Place on gentle hydration with IV fluids.  Will need outpatient follow-up with nephrology.  Follow.  5.??  Liver mass Initial concern for 2.3 cm right lobe mass concerning for neoplasm seen on right upper quadrant ultrasound 11/26/2018.  Repeat ultrasound 5 days later could not see a mass.  Patient unable to have a MRI due to PPM.  CT abdomen and pelvis done during this hospitalization without contrast with no mention of  a liver mass.  Acute hepatitis panel negative.  Outpatient evaluation with GI.  6.  Multiple myeloma Outpatient follow-up with oncology.  7.  Paroxysmal atrial fibrillation Continue Coreg and digoxin.  Eliquis on hold patient on heparin drip as patient did get ultrasound-guided paracentesis.  Monitor on heparin for now.  If no further paracentesis needed will transition to Eliquis on  discharge.  8.  Hypertension Blood pressure stable.  Continue Coreg.   DVT prophylaxis: Heparin Code Status: Full Family Communication: Updated patient and husband and son at bedside. Disposition Plan: Home when clinically improved with improvement in renal function.   Consultants:   None  Procedures:   Ultrasound-guided paracentesis--3.2 L of hazy amber fluid removed per interventional radiology 12/15/2018  CT renal stone protocol 12/14/2018    Antimicrobials:   None   Subjective: Patient laying in bed.  Some abdominal improvement since paracentesis on 12/15/2018.  Tolerating some oral intake.  Complains of multiple loose stools.  No chest pain.  No shortness of breath.  Patient noted to have a hypoglycemic episode this morning with a CBG of 34 with complaints of dizziness which improved to 80 after patient given some orange juice and a packet of sugar.  Objective: Vitals:   12/16/18 0507 12/16/18 0850 12/16/18 1111 12/16/18 1121  BP: 127/67  (!) 140/55   Pulse: (!) 54 65  64  Resp: 18     Temp: 97.8 F (36.6 C)     TempSrc: Oral     SpO2: 100%     Weight:      Height:        Intake/Output Summary (Last 24 hours) at 12/16/2018 1202 Last data filed at 12/16/2018 1000 Gross per 24 hour  Intake 1254.14 ml  Output -  Net 1254.14 ml   Filed Weights   12/15/18 0321  Weight: 68 kg    Examination:  General exam: Appears calm and comfortable  Respiratory system: Clear to auscultation. Respiratory effort normal. Cardiovascular system: S1 & S2 heard, RRR. No JVD, murmurs, rubs, gallops or clicks. No pedal edema. Gastrointestinal system: Abdomen is soft, some tenderness in the epigastrium and right upper quadrant, positive bowel sounds, no rebound, no guarding. Central nervous system: Alert and oriented. No focal neurological deficits. Extremities: Symmetric 5 x 5 power. Skin: No rashes, lesions or ulcers Psychiatry: Judgement and insight appear normal. Mood & affect  appropriate.     Data Reviewed: I have personally reviewed following labs and imaging studies  CBC: Recent Labs  Lab 12/14/18 2313 12/15/18 0212 12/16/18 0147  WBC 4.5  --  3.4*  NEUTROABS 3.3  --   --   HGB 10.1* 8.5* 9.5*  HCT 31.9* 25.0* 30.2*  MCV 96.7  --  95.6  PLT 134*  --  350*   Basic Metabolic Panel: Recent Labs  Lab 12/15/18 0212 12/16/18 0147  NA 142 139  K 4.8 4.3  CL 117* 110  CO2  --  22  GLUCOSE 132* 106*  BUN 50* 43*  CREATININE 2.00* 2.56*  CALCIUM  --  8.5*  MG  --  2.1   GFR: Estimated Creatinine Clearance: 18 mL/min (A) (by C-G formula based on SCr of 2.56 mg/dL (H)). Liver Function Tests: Recent Labs  Lab 12/14/18 2313 12/16/18 0147  AST 210* 176*  ALT 27 21  ALKPHOS 454* 359*  BILITOT 0.7 0.6  PROT 8.2* 6.9  ALBUMIN 3.0* 2.4*   Recent Labs  Lab 12/14/18 2313  LIPASE 27   No results  for input(s): AMMONIA in the last 168 hours. Coagulation Profile: No results for input(s): INR, PROTIME in the last 168 hours. Cardiac Enzymes: No results for input(s): CKTOTAL, CKMB, CKMBINDEX, TROPONINI in the last 168 hours. BNP (last 3 results) No results for input(s): PROBNP in the last 8760 hours. HbA1C: Recent Labs    12/16/18 0147  HGBA1C 8.7*   CBG: Recent Labs  Lab 12/15/18 2147 12/16/18 0432 12/16/18 0503 12/16/18 0807 12/16/18 1156  GLUCAP 177* 34* 80 123* 149*   Lipid Profile: No results for input(s): CHOL, HDL, LDLCALC, TRIG, CHOLHDL, LDLDIRECT in the last 72 hours. Thyroid Function Tests: No results for input(s): TSH, T4TOTAL, FREET4, T3FREE, THYROIDAB in the last 72 hours. Anemia Panel: Recent Labs    12/15/18 0925  VITAMINB12 485  FOLATE 8.0  FERRITIN 572*  TIBC 313  IRON 44  RETICCTPCT 1.3   Sepsis Labs: No results for input(s): PROCALCITON, LATICACIDVEN in the last 168 hours.  Recent Results (from the past 240 hour(s))  C difficile quick scan w PCR reflex     Status: None   Collection Time: 12/15/18  11:11 AM  Result Value Ref Range Status   C Diff antigen NEGATIVE NEGATIVE Final   C Diff toxin NEGATIVE NEGATIVE Final   C Diff interpretation No C. difficile detected.  Final    Comment: Performed at [facility], [place] [address], [place], [place] [address]  Culture, body fluid-bottle     Status: None (Preliminary result)   Collection Time: 12/15/18  3:58 PM  Result Value Ref Range Status   Specimen Description FLUID PERITONEAL  Final   Special Requests BOTTLES DRAWN AEROBIC AND ANAEROBIC  Final   Culture   Final    NO GROWTH < 12 HOURS Performed at [facility], [place] [address] [place], [place] [address]    Report Status PENDING  Incomplete         Radiology Studies: US Paracentesis  Result Date: 12/15/2018 INDICATION: Patient with history of multiple myeloma, cirrhosis, abdominal distension, and ascites. Request is made for diagnostic and therapeutic paracentesis. EXAM: ULTRASOUND GUIDED DIAGNOSTIC AND THERAPEUTIC PARACENTESIS MEDICATIONS: 10 mL of 1% lidocaine COMPLICATIONS: None immediate. PROCEDURE: Informed written consent was obtained from the patient after a discussion of the risks, benefits and alternatives to treatment. A timeout was performed prior to the initiation of the procedure. Initial ultrasound scanning demonstrates a moderate amount of ascites within the right lower abdominal quadrant. The right lower abdomen was prepped and draped in the usual sterile fashion. 1% lidocaine was used for local anesthesia. Following this, a 19 gauge, 7-cm, Yueh catheter was introduced. An ultrasound image was saved for documentation purposes. The paracentesis was performed. The catheter was removed and a dressing was applied. The patient tolerated the procedure well without immediate post procedural complication. FINDINGS: A total of approximately 3.2 L of hazy amber fluid was removed. Samples were sent to the laboratory as requested by the clinical team.  IMPRESSION: Successful ultrasound-guided paracentesis yielding 3.2 L of peritoneal fluid. Read by: Earley Abide, PA-C Electronically Signed   By: Sandi Mariscal M.D.   On: 12/15/2018 15:48   Ct Renal Stone Study  Result Date: 12/15/2018 CLINICAL DATA:  Abdominal distention and pain for 2 weeks. EXAM: CT ABDOMEN AND PELVIS WITHOUT CONTRAST TECHNIQUE: Multidetector CT imaging of the abdomen and pelvis was performed following the standard protocol without IV contrast. COMPARISON:  Chest CT report 10/19/2000 FINDINGS: Lower chest: Cardiomegaly with right atrial and right ventricular leads  identified. No pericardial effusion or thickening. Mild bronchiectasis to the lower lobes with left lower lobe atelectasis. No effusion or pneumothorax. Hepatobiliary: Cirrhotic appearing liver without definite space-occupying mass given limitations of a noncontrast study. Tiny granuloma in the right hepatic lobe. Gallbladder is physiologically distended and contains layering gallstones. No mural thickening is identified. Pancreas: Normal without ductal dilatation Spleen: Normal size spleen without mass. Adrenals/Urinary Tract: Normal bilateral adrenal glands and kidneys without nephrolithiasis nor hydroureteronephrosis. The urinary bladder is unremarkable. Stomach/Bowel: Stomach is within normal limits. Appendix is not confidently identified. No evidence of bowel wall thickening, distention, or inflammatory changes. Vascular/Lymphatic: Moderate aortoiliac atherosclerosis. Lymphadenopathy. Reproductive: Uterus appears surgically absent. No adnexal mass is seen. Other: Moderate to large volume of ascites with soft tissue anasarca. Musculoskeletal: No acute nor suspicious osseous abnormalities. Mild anterior height loss of L4 without retropulsion, likely chronic. Facet arthrosis is noted of the lumbar spine. IMPRESSION: 1. Moderate to large volume of ascites with soft tissue anasarca. 2. Cirrhotic appearing liver without definite  space-occupying mass given limitations of a noncontrast study. 3. Uncomplicated cholelithiasis. 4. Aortoiliac atherosclerosis. Electronically Signed   By: Ashley Royalty M.D.   On: 12/15/2018 00:22        Scheduled Meds: . allopurinol  200 mg Oral Daily  . calcitRIOL  0.25 mcg Oral Daily  . carvedilol  3.125 mg Oral BID WC  . digoxin  0.125 mg Oral Daily  . gabapentin  100 mg Oral BID  . hydrALAZINE  25 mg Oral TID  . insulin aspart  0-9 Units Subcutaneous TID WC  . levothyroxine  75 mcg Oral QAC breakfast  . magnesium oxide  400 mg Oral Daily  . sodium bicarbonate  650 mg Oral BID   Continuous Infusions: . heparin 1,000 Units/hr (12/16/18 1000)     LOS: 0 days    Time spent: 35 minutes    Irine Seal, MD [facility]  If 7PM-7AM, please contact night-coverage [email or web address] 12/16/2018, 12:02 PM

## 2018-12-16 NOTE — Care Management Note (Signed)
Case Management Note  Patient Details  Name: TAMMEY DEEG MRN: [id number] Date of Birth: [birth year]  Subjective/Objective: [facility] rep Ronalee Belts already following for [place] orders. Provided patient w/[facility] pcp listing.                   Action/Plan:dc home w/HHC.   Expected Discharge Date:                  Expected Discharge Plan:  [place]  In-House Referral:     Discharge planning Services  CM Consult  Post Acute Care Choice:  Home Health(Active w/[facility] HHRN/HHPT) Choice offered to:  Patient  DME Arranged:    DME Agency:     HH Arranged:    [place] Agency:     Status of Service:  In process, will continue to follow  If discussed at Long Length of Stay Meetings, dates discussed:    Additional Comments:  Dessa Phi, RN 12/16/2018, 4:26 PM

## 2018-12-16 NOTE — Care Management Obs Status (Signed)
[facility] NOTIFICATION   Patient Details  Name: Laura Sampson MRN: [id number] Date of Birth: 1946-06-14   Medicare Observation Status Notification Given:  Yes    MahabirJuliann Pulse, RN 12/16/2018, 4:25 PM

## 2018-12-16 NOTE — Progress Notes (Signed)
Pharmacy: Re- heparin  Patient's a 73 y.o F with hx afib and portal vein thrombosis currently on heparin drip (home Eliquis on hold- last dose on 12/14/18 at 0900). S/p paracentesis on 12/15/18 at ~3:25p.  - Heparin level is supra-therapeutic at 0.84 and aPTT 135 (goal 0.3-0.7, goal aPTT 66-102 sec). Since levels are correlating, will monitor based on heparin level - scr trending up (2.56) so patient may be accumulating drug  Plan: - decrease heparin drip to 1000 units/hr  - check 8 hr heparin level - monitor for s/s bleeding  Dia Sitter, PharmD, BCPS 12/16/2018 3:27 AM

## 2018-12-16 NOTE — Evaluation (Signed)
Physical Therapy Evaluation Patient Details Name: Laura Sampson MRN: [id number] DOB: 1946/02/07 Today's Date: 12/16/2018   History of Present Illness  73 year old female history of type 2 diabetes, hypertension, coronary artery disease status post CABG, status post PPM, recent diagnosis of multiple myeloma, chronic kidney disease and admitted for Clostridium difficile diarrhea and pt also with ascites and R lobe hepatic mass.  S/p paracentesis 12/15/2018  Clinical Impression  Pt admitted with above diagnosis. Pt currently with functional limitations due to the deficits listed below (see PT Problem List).  Pt will benefit from skilled PT to increase their independence and safety with mobility to allow discharge to the venue listed below.  Pt assisted with ambulating in hallway and reported "feeling under water" during ambulation and required min assist to return to room.  Pt reports she plans to d/c home to her daughter's and does not have an assistive device in [place] with her (visiting from out of town).  Recommend initial 24/7 supervision for safety, HHPT and RW upon d/c.     Follow Up Recommendations Home health PT;Supervision/Assistance - 24 hour    Equipment Recommendations  Rolling walker with 5" wheels    Recommendations for Other Services       Precautions / Restrictions Precautions Precautions: Fall      Mobility  Bed Mobility Overal bed mobility: Needs Assistance Bed Mobility: Supine to Sit;Sit to Supine     Supine to sit: Supervision Sit to supine: Supervision   General bed mobility comments: for lines  Transfers Overall transfer level: Needs assistance Equipment used: Rolling walker (2 wheeled) Transfers: Sit to/from Stand Sit to Stand: Min guard;Min assist         General transfer comment: verbal cues for hand placement, min/guard with rise and min assist to control descent as pt a little "swimmy headed" with ambulating  Ambulation/Gait Ambulation/Gait  assistance: Min assist Gait Distance (Feet): 100 Feet Assistive device: Rolling walker (2 wheeled) Gait Pattern/deviations: Step-through pattern;Decreased stride length     General Gait Details: verbal cues for RW positioning and posture, pt became a little unsteady and reports feeling "under water" upon return to room and required more assist; likely due to pain meds taken previously; BP 129/61 mmHg upon returning to bed  Stairs            Wheelchair Mobility    Modified Rankin (Stroke Patients Only)       Balance Overall balance assessment: Needs assistance         Standing balance support: Bilateral upper extremity supported Standing balance-Leahy Scale: Poor Standing balance comment: utilized RW today (pt does not use at home normally uses cane if any AD), required assist for steadying with ambulation due to dizziness                             Pertinent Vitals/Pain Pain Assessment: Faces Faces Pain Scale: Hurts a little bit Pain Location: abdomen Pain Descriptors / Indicators: Aching;Pressure Pain Intervention(s): Limited activity within patient's tolerance;Premedicated before session(reports receiving pain meds earlier)    Home Living Family/patient expects to be discharged to:: Private residence Living Arrangements: Children Available Help at Discharge: Family Type of Home: Apartment Home Access: Stairs to enter Entrance Stairs-Rails: Right Entrance Stairs-Number of Steps: 12 Home Layout: One level   Additional Comments: pt reports being from a town near [place] and visiting family however plans to return to daughter's apt in [place] upon d/c (as above)  Prior Function Level of Independence: Independent               Hand Dominance        Extremity/Trunk Assessment        Lower Extremity Assessment Lower Extremity Assessment: Generalized weakness       Communication   Communication: No difficulties  Cognition  Arousal/Alertness: Awake/alert Behavior During Therapy: WFL for tasks assessed/performed Overall Cognitive Status: Within Functional Limits for tasks assessed                                        General Comments      Exercises     Assessment/Plan    PT Assessment Patient needs continued PT services  PT Problem List Decreased strength;Decreased mobility;Decreased activity tolerance;Decreased balance;Decreased knowledge of use of DME       PT Treatment Interventions DME instruction;Functional mobility training;Gait training;Therapeutic activities;Neuromuscular re-education;Stair training;Therapeutic exercise;Balance training;Patient/family education    PT Goals (Current goals can be found in the Care Plan section)  Acute Rehab PT Goals PT Goal Formulation: With patient Time For Goal Achievement: 12/30/18 Potential to Achieve Goals: Good    Frequency Min 3X/week   Barriers to discharge        Co-evaluation               AM-PAC PT "6 Clicks" Mobility  Outcome Measure Help needed turning from your back to your side while in a flat bed without using bedrails?: None Help needed moving from lying on your back to sitting on the side of a flat bed without using bedrails?: A Little Help needed moving to and from a bed to a chair (including a wheelchair)?: A Little Help needed standing up from a chair using your arms (e.g., wheelchair or bedside chair)?: A Little Help needed to walk in hospital room?: A Little Help needed climbing 3-5 steps with a railing? : A Lot 6 Click Score: 18    End of Session Equipment Utilized During Treatment: Gait belt Activity Tolerance: Patient tolerated treatment well Patient left: in bed;with call bell/phone within reach;with bed alarm set Nurse Communication: Mobility status PT Visit Diagnosis: Other abnormalities of gait and mobility (R26.89)    Time: 9811-9147 PT Time Calculation (min) (ACUTE ONLY): 15  min   Charges:   PT Evaluation $PT Eval Low Complexity: [place], PT, DPT Acute Rehabilitation Services Office: [phone] Pager: [phone]  Trena Platt 12/16/2018, 3:39 PM

## 2018-12-16 NOTE — Progress Notes (Signed)
[place] for heparin Indication: hx atrial fibrillation, portal vein thrombosis (home Eliquis on hold)  Patient Measurements: weight 68 kg, height 62 inches Height: 5' 2" (157.5 cm) Weight: 150 lb (68 kg) IBW/kg (Calculated) : 50.1 Heparin Dosing Weight: 64 kg  Vital Signs: Temp: 97.8 F (36.6 C) (01/10 0507) Temp Source: Oral (01/10 0507) BP: 140/55 (01/10 1111) Pulse Rate: 64 (01/10 1121)  Labs: Recent Labs    12/14/18 2313 12/15/18 0212  12/15/18 0253 12/15/18 1341 12/15/18 2257 12/16/18 0147 12/16/18 1218  HGB 10.1* 8.5*  --   --   --   --  9.5*  --   HCT 31.9* 25.0*  --   --   --   --  30.2*  --   PLT 134*  --   --   --   --   --  125*  --   APTT  --   --   --  34 59*  --  135*  --   HEPARINUNFRC  --   --    < > 1.46* 0.88* 0.75* 0.84* 0.90*  CREATININE  --  2.00*  --   --   --   --  2.56*  --    < > = values in this interval not displayed.    Estimated Creatinine Clearance: 18 mL/min (A) (by C-G formula based on SCr of 2.56 mg/dL (H)).   Medications:  - on eliquis 5 mg bid PTA (last dose 12/14/18 at 0900)  Assessment: Patient's a 73 y.o F with hx multiple myeloma, SBP, Afib, and portal vein thrombosis on Eliquis PTA, presented to the ED on 12/15/18 with c/o abd pain.  CT on 12/15/18 showed moderate to large volume of ascites.  To transition anticoagulant from Eliquis to heparin drip in anticipation for paracentesis. 12/16/2018  S/p paracentesis yesterday, heparin resumed at 1800 at rate of 1100 units/hr and 8 hr HL elevated at 0.84 then rate decreased to 1000 units/hr and HL drawn 5 hr later remains elevated at 0.9 - not steady state level . Hg stable at 9.5, pltc remains low at 125. Scr elevated at 2.56.  Goal of Therapy:  Heparin level 0.3-0.7 units/ml Monitor platelets by anticoagulation protocol: Yes  aPTT goal 66-102 seconds   Plan:  - decrease heparin drip to 900 units/hr - check 8 hr heparin level  - monitor for s/s  bleeding - f/u to resume home Eliquis  Eudelia Bunch, Pharm.D [phone] 12/16/2018 12:54 PM

## 2018-12-17 LAB — COMPREHENSIVE METABOLIC PANEL
ALT: 18 U/L (ref 0–44)
AST: 160 U/L — AB (ref 15–41)
Albumin: 3.6 g/dL (ref 3.5–5.0)
Alkaline Phosphatase: 290 U/L — ABNORMAL HIGH (ref 38–126)
Anion gap: 7 (ref 5–15)
BUN: 36 mg/dL — AB (ref 8–23)
CO2: 22 mmol/L (ref 22–32)
Calcium: 8.4 mg/dL — ABNORMAL LOW (ref 8.9–10.3)
Chloride: 109 mmol/L (ref 98–111)
Creatinine, Ser: 2.27 mg/dL — ABNORMAL HIGH (ref 0.44–1.00)
GFR calc Af Amer: 24 mL/min — ABNORMAL LOW (ref >60)
GFR calc non Af Amer: 21 mL/min — ABNORMAL LOW (ref >60)
Glucose, Bld: 179 mg/dL — ABNORMAL HIGH (ref 70–99)
Potassium: 4.5 mmol/L (ref 3.5–5.1)
Sodium: 138 mmol/L (ref 135–145)
Total Bilirubin: 0.6 mg/dL (ref 0.3–1.2)
Total Protein: 6.9 g/dL (ref 6.5–8.1)

## 2018-12-17 LAB — URINE CULTURE: Culture: 80000 — AB

## 2018-12-17 LAB — CBC
HCT: 25.4 % — ABNORMAL LOW (ref 36.0–46.0)
Hemoglobin: 8 g/dL — ABNORMAL LOW (ref 12.0–15.0)
MCH: 30.3 pg (ref 26.0–34.0)
MCHC: 31.5 g/dL (ref 30.0–36.0)
MCV: 96.2 fL (ref 80.0–100.0)
Platelets: 90 10*3/uL — ABNORMAL LOW (ref 150–400)
RBC: 2.64 MIL/uL — ABNORMAL LOW (ref 3.87–5.11)
RDW: 18.7 % — ABNORMAL HIGH (ref 11.5–15.5)
WBC: 2.7 10*3/uL — ABNORMAL LOW (ref 4.0–10.5)
nRBC: 0 % (ref 0.0–0.2)

## 2018-12-17 LAB — GLUCOSE, CAPILLARY
Glucose-Capillary: 154 mg/dL — ABNORMAL HIGH (ref 70–99)
Glucose-Capillary: 170 mg/dL — ABNORMAL HIGH (ref 70–99)
Glucose-Capillary: 195 mg/dL — ABNORMAL HIGH (ref 70–99)
Glucose-Capillary: 187 mg/dL — ABNORMAL HIGH (ref 70–99)

## 2018-12-17 MED ORDER — APIXABAN 2.5 MG PO TABS
2.5000 mg | ORAL_TABLET | Freq: Two times a day (BID) | ORAL | Status: DC
  Administered 2018-12-17 – 2018-12-18 (×4): 2.5 mg via ORAL
  Filled 2018-12-17 (×4): qty 1

## 2018-12-17 MED ORDER — LOPERAMIDE HCL 2 MG PO CAPS
4.0000 mg | ORAL_CAPSULE | Freq: Once | ORAL | Status: AC
  Administered 2018-12-17: 4 mg via ORAL
  Filled 2018-12-17: qty 2

## 2018-12-17 NOTE — Progress Notes (Signed)
Patient started bleeding (fresh, bright red ) from his left nares around 2220 . Notified to pharmacist and stopped heparin drip at 2222. Informed on call X.[place]. Stat CBC done. Hgb - 8.8, pt is alert and oriented,her son at bed side. Applied Ice and pressure on her nose. Oxymetazoline 0.05% nasal spray x 2 spray @2311 . Applied nasal packing on her left nares. Bleeding is slow down after hour . V/S@2226  : 150/63,HR-88   And V/S@2327 : 148/48,HR-77, o2 sats 98% in RA. Will continue to monitor. Pt refused to take night time medicine.

## 2018-12-17 NOTE — Progress Notes (Signed)
PROGRESS NOTE    TEPHANIE ESCORCIA  PYP:[id number] DOB: Apr 09, 1946 DOA: 12/14/2018 PCP: System, Pcp Not In    Brief Narrative:  Per [name] is a 73 y.o. female with medical history significant of DM2, HTN, CAD s/p CABG, PPM.  Recent diagnosis of MM.  Patient recently hospitalized in OSH at end of December for abd pain, swelling, diarrhea.  Work up during hospital stay showed cirrhotic appearance of liver, ascites, though not enough ascites apparently to tap when they tried.  Stool came back positive for C.Diff.  Patient was started on PO vanc, bumex, and aldactone.  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  Also saw a 2.3cm right lobe hepatic mass on [place] 12/21, though the same mass couldn't be visualized on repeat US x5 days later on the 26th.   ED Course: ALK 454, Creat 2.0 AST 210 ALT 27 (about what they had been running at OSH).  CT abd/pelvis shows: Moderate to large volume of ascites with soft tissue anasarca.    Assessment & Plan:   Principal Problem:   Clostridium difficile diarrhea Active Problems:   Myeloma ([place])   Liver mass   Cirrhosis (HCC)   Ascites   DM2 (diabetes mellitus, type 2) (HCC)   HTN (hypertension)   PAF (paroxysmal atrial fibrillation) (HCC)   CKD (chronic kidney disease) stage 4, GFR 15-29 ml/min (HCC)  1 diarrhea/recent diagnosis of C. difficile colitis at outside hospital Patient noted to have recently been diagnosed with a C. difficile colitis at outside hospital.  Patient still with complaints of multiple loose stools.  C. difficile PCR was repeated which was negative.  Pharmacy noted that patient has 12/15/2018 had had a full 14-day course of treatment for C. difficile colitis.  Oral vancomycin has been discontinued.  Bumex and spironolactone on hold.  GI pathogen panel negative.  We will give a dose of Imodium 4 mg p.o. x1.  Imodium as needed.  Follow.  2.  Cirrhosis with ascites/abdominal pain Patient status  post ultrasound-guided paracentesis with 3.2 L of hazy fluid removed.  Gram stain and cultures negative to date.  Doubt if patient has SBP.  Patient with some improvement with moderate abdominal discomfort post paracentesis.  Urinalysis nitrite negative, leukocytes negative, protein negative.  Bumex and spironolactone on hold due to probable dehydration from multiple episodes of diarrhea.  Slight bump in creatinine.  Acute hepatitis panel is negative.  Status post IV albumin every 6 hours x1 day.  Continue PPI.  Will need outpatient follow-up with gastroenterology.    3.  Diabetes mellitus type 2 Patient noted to have a hypoglycemic episode with a CBG of 34 on 12/16/2018, which improved after orange juice and packets of sugar.  Hemoglobin A1c 8.7.  CBG of 154 this morning.  70/30 has been discontinued.  Continue sliding scale insulin.    4.  Chronic kidney disease stage IV Baseline creatinine approximately 2.  Creatinine currently at 2.27 from 2.56.  Urinalysis negative for proteinuria.  Patient with slight bump in creatinine may be secondary to prerenal azotemia from multiple episodes of diarrhea which is trending back down..  Continue to hold diuretics of Bumex and spironolactone.  Continue calcitriol.  Continue gentle hydration for another 24 hours.  Will need outpatient follow-up with nephrology.  5.??  Liver mass Initial concern for 2.3 cm right lobe mass concerning for neoplasm seen on right upper quadrant ultrasound 11/26/2018.  Repeat ultrasound 5 days later could not see a mass.  Patient unable to have a MRI due to PPM.  CT abdomen and pelvis done during this hospitalization without contrast with no mention of a liver mass.  Acute hepatitis panel negative.  Outpatient evaluation with GI.  6.  Multiple myeloma Outpatient follow-up with oncology.  7.  Paroxysmal atrial fibrillation Continue Coreg and digoxin.  Eliquis was held and patient placed on a heparin drip in anticipation of  ultrasound-guided paracentesis.  Patient status post paracentesis.  Heparin drip discontinued as patient noted to have a bout of epistaxis the night of 12/16/2018.  Resume Eliquis at 2.5 mg twice daily as dosed per pharmacy due to patient's renal function and history of cirrhosis.    8.  Hypertension Blood pressure stable.  Continue Coreg.   DVT prophylaxis: Heparin Code Status: Full Family Communication: Updated patient and husband at bedside. Disposition Plan: Home when clinically improved with improvement in renal function.   Consultants:   None  Procedures:   Ultrasound-guided paracentesis--3.2 L of hazy amber fluid removed per interventional radiology 12/15/2018  CT renal stone protocol 12/14/2018    Antimicrobials:   None   Subjective: Patient in bed.  Feeling somewhat better.  Events overnight of epistaxis noted.  Patient with no further nasal bleeding at this time.  Heparin has been discontinued.  Patient states loose stools decreasing.  Denies any chest pain or shortness of breath.  No further hypoglycemic spells.    Objective: Vitals:   12/16/18 2134 12/16/18 2226 12/16/18 2327 12/17/18 0547  BP: (!) 123/58 (!) 150/63 (!) 148/48 (!) 115/41  Pulse: 82 88 77 81  Resp: 16 (!) 28  18  Temp: 98.6 F (37 C) 98 F (36.7 C)  98.7 F (37.1 C)  TempSrc: Oral Oral  Oral  SpO2: 97% 96% 98% 100%  Weight:      Height:        Intake/Output Summary (Last 24 hours) at 12/17/2018 1319 Last data filed at 12/17/2018 1300 Gross per 24 hour  Intake 2592.9 ml  Output 0 ml  Net 2592.9 ml   Filed Weights   12/15/18 0321  Weight: 68 kg    Examination:  General exam: NAD Respiratory system: CTAB.  No wheezes, no crackles, no rhonchi. Respiratory effort normal. Cardiovascular system: Regular rate rhythm no murmurs rubs or gallops.  No JVD.  No lower extremity edema.  Gastrointestinal system: Abdomen is soft, minimally distended, some tenderness in the epigastrium and right  upper quadrant, positive bowel sounds, no rebound, no guarding. Central nervous system: Alert and oriented. No focal neurological deficits. Extremities: Symmetric 5 x 5 power. Skin: No rashes, lesions or ulcers Psychiatry: Judgement and insight appear normal. Mood & affect appropriate.     Data Reviewed: I have personally reviewed following labs and imaging studies  CBC: Recent Labs  Lab 12/14/18 2313 12/15/18 0212 12/16/18 0147 12/16/18 2307 12/17/18 0459  WBC 4.5  --  3.4* 3.2* 2.7*  NEUTROABS 3.3  --   --   --   --   HGB 10.1* 8.5* 9.5* 8.8* 8.0*  HCT 31.9* 25.0* 30.2* 28.1* 25.4*  MCV 96.7  --  95.6 94.6 96.2  PLT 134*  --  125* 120* 90*   Basic Metabolic Panel: Recent Labs  Lab 12/15/18 0212 12/16/18 0147 12/17/18 0459  NA 142 139 138  K 4.8 4.3 4.5  CL 117* 110 109  CO2  --  22 22  GLUCOSE 132* 106* 179*  BUN 50* 43* 36*  CREATININE 2.00* 2.56* 2.27*  CALCIUM  --  8.5* 8.4*  MG  --  2.1  --    GFR: Estimated Creatinine Clearance: 20.3 mL/min (A) (by C-G formula based on SCr of 2.27 mg/dL (H)). Liver Function Tests: Recent Labs  Lab 12/14/18 2313 12/16/18 0147 12/17/18 0459  AST 210* 176* 160*  ALT 27 21 18   ALKPHOS 454* 359* 290*  BILITOT 0.7 0.6 0.6  PROT 8.2* 6.9 6.9  ALBUMIN 3.0* 2.4* 3.6   Recent Labs  Lab 12/14/18 2313  LIPASE 27   No results for input(s): AMMONIA in the last 168 hours. Coagulation Profile: No results for input(s): INR, PROTIME in the last 168 hours. Cardiac Enzymes: No results for input(s): CKTOTAL, CKMB, CKMBINDEX, TROPONINI in the last 168 hours. BNP (last 3 results) No results for input(s): PROBNP in the last 8760 hours. HbA1C: Recent Labs    12/16/18 0147  HGBA1C 8.7*   CBG: Recent Labs  Lab 12/16/18 1156 12/16/18 1657 12/16/18 2128 12/17/18 0808 12/17/18 1201  GLUCAP 149* 135* 171* 154* 170*   Lipid Profile: No results for input(s): CHOL, HDL, LDLCALC, TRIG, CHOLHDL, LDLDIRECT in the last 72  hours. Thyroid Function Tests: No results for input(s): TSH, T4TOTAL, FREET4, T3FREE, THYROIDAB in the last 72 hours. Anemia Panel: Recent Labs    12/15/18 0925  VITAMINB12 485  FOLATE 8.0  FERRITIN 572*  TIBC 313  IRON 44  RETICCTPCT 1.3   Sepsis Labs: No results for input(s): PROCALCITON, LATICACIDVEN in the last 168 hours.  Recent Results (from the past 240 hour(s))  Gastrointestinal Panel by PCR , Stool     Status: None   Collection Time: 12/15/18 11:11 AM  Result Value Ref Range Status   Campylobacter species NOT DETECTED NOT DETECTED Final   Plesimonas shigelloides NOT DETECTED NOT DETECTED Final   Salmonella species NOT DETECTED NOT DETECTED Final   Yersinia enterocolitica NOT DETECTED NOT DETECTED Final   Vibrio species NOT DETECTED NOT DETECTED Final   Vibrio cholerae NOT DETECTED NOT DETECTED Final   Enteroaggregative E coli (EAEC) NOT DETECTED NOT DETECTED Final   Enteropathogenic E coli (EPEC) NOT DETECTED NOT DETECTED Final   Enterotoxigenic E coli (ETEC) NOT DETECTED NOT DETECTED Final   Shiga like toxin producing E coli (STEC) NOT DETECTED NOT DETECTED Final   Shigella/Enteroinvasive E coli (EIEC) NOT DETECTED NOT DETECTED Final   Cryptosporidium NOT DETECTED NOT DETECTED Final   Cyclospora cayetanensis NOT DETECTED NOT DETECTED Final   Entamoeba histolytica NOT DETECTED NOT DETECTED Final   Giardia lamblia NOT DETECTED NOT DETECTED Final   Adenovirus F40/41 NOT DETECTED NOT DETECTED Final   Astrovirus NOT DETECTED NOT DETECTED Final   Norovirus GI/GII NOT DETECTED NOT DETECTED Final   Rotavirus A NOT DETECTED NOT DETECTED Final   Sapovirus (I, II, IV, and V) NOT DETECTED NOT DETECTED Final    Comment: Performed at [facility], [address] [place], [place] [address]  C difficile quick scan w PCR reflex     Status: None   Collection Time: 12/15/18 11:11 AM  Result Value Ref Range Status   C Diff antigen NEGATIVE NEGATIVE Final   C Diff toxin  NEGATIVE NEGATIVE Final   C Diff interpretation No C. difficile detected.  Final    Comment: Performed at [facility], [place] [address], [place], [place] [address]  Culture, body fluid-bottle     Status: None (Preliminary result)   Collection Time: 12/15/18  3:58 PM  Result Value Ref Range Status   Specimen Description FLUID  PERITONEAL  Final   Special Requests BOTTLES DRAWN AEROBIC AND ANAEROBIC  Final   Culture   Final    NO GROWTH 2 DAYS Performed at [facility], [place] [address] [place], [place] [address]    Report Status PENDING  Incomplete  Gram stain     Status: None   Collection Time: 12/15/18  3:58 PM  Result Value Ref Range Status   Specimen Description FLUID PERITONEAL  Final   Special Requests NONE  Final   Gram Stain   Final    CYTOSPIN SMEAR NO WBC SEEN NO ORGANISMS SEEN Performed at [facility], [address], [place], [place] [address]    Report Status 12/16/2018 FINAL  Final  Culture, Urine     Status: Abnormal   Collection Time: 12/16/18  6:38 AM  Result Value Ref Range Status   Specimen Description URINE, CLEAN CATCH  Final   Special Requests   Final    NONE Performed at [place] [address] [place], [place] [address]    Culture (A)  Final    80,000 COLONIES/mL MULTIPLE SPECIES PRESENT, SUGGEST RECOLLECTION   Report Status 12/17/2018 FINAL  Final         Radiology Studies: US Renal  Result Date: 12/16/2018 CLINICAL DATA:  Acute renal failure. EXAM: RENAL / URINARY TRACT ULTRASOUND COMPLETE COMPARISON:  12/15/2018. FINDINGS: Right Kidney: Renal measurements: 10.6 x 4.1 x 5.1 cm = volume: 117.8 mL. Increased echogenicity. No mass or hydronephrosis visualized. Left Kidney: Renal measurements: 9.9 x 4.8 x 5.0 cm = volume: 125.2 mL. Small amount of perinephric fluid noted. Increased echogenicity. No mass or hydronephrosis visualized. Bladder: Appears normal for degree of bladder distention.  Incidental note is made of mild ascites. IMPRESSION: 1. Increased renal echogenicity noted bilaterally. Chronic medical renal disease could present this fashion. Small amount of left perinephric fluid noted. Active renal pathology including pyelonephritis can not be excluded. No acute renal abnormality otherwise noted. No hydronephrosis or bladder distention. 2.  Mild ascites. Electronically Signed   By: Marcello Moores  Register   On: 12/16/2018 17:22   US Paracentesis  Result Date: 12/15/2018 INDICATION: Patient with history of multiple myeloma, cirrhosis, abdominal distension, and ascites. Request is made for diagnostic and therapeutic paracentesis. EXAM: ULTRASOUND GUIDED DIAGNOSTIC AND THERAPEUTIC PARACENTESIS MEDICATIONS: 10 mL of 1% lidocaine COMPLICATIONS: None immediate. PROCEDURE: Informed written consent was obtained from the patient after a discussion of the risks, benefits and alternatives to treatment. A timeout was performed prior to the initiation of the procedure. Initial ultrasound scanning demonstrates a moderate amount of ascites within the right lower abdominal quadrant. The right lower abdomen was prepped and draped in the usual sterile fashion. 1% lidocaine was used for local anesthesia. Following this, a 19 gauge, 7-cm, Yueh catheter was introduced. An ultrasound image was saved for documentation purposes. The paracentesis was performed. The catheter was removed and a dressing was applied. The patient tolerated the procedure well without immediate post procedural complication. FINDINGS: A total of approximately 3.2 L of hazy amber fluid was removed. Samples were sent to the laboratory as requested by the clinical team. IMPRESSION: Successful ultrasound-guided paracentesis yielding 3.2 L of peritoneal fluid. Read by: Earley Abide, PA-C Electronically Signed   By: Sandi Mariscal M.D.   On: 12/15/2018 15:48        Scheduled Meds: . allopurinol  200 mg Oral Daily  . apixaban  2.5 mg Oral BID    . calcitRIOL  0.25  mcg Oral Daily  . carvedilol  3.125 mg Oral BID WC  . digoxin  0.125 mg Oral Daily  . gabapentin  100 mg Oral BID  . hydrALAZINE  25 mg Oral TID  . insulin aspart  0-9 Units Subcutaneous TID WC  . levothyroxine  75 mcg Oral QAC breakfast  . loperamide  4 mg Oral Once  . magnesium oxide  400 mg Oral Daily  . oxymetazoline  2 spray Each Nare BID  . sodium bicarbonate  650 mg Oral BID   Continuous Infusions: . sodium chloride 75 mL/hr at 12/17/18 1300     LOS: 1 day    Time spent: 35 minutes    Irine Seal, MD [facility]  If 7PM-7AM, please contact night-coverage [email or web address] 12/17/2018, 1:19 PM

## 2018-12-17 NOTE — Progress Notes (Signed)
ANTICOAGULATION CONSULT NOTE  Pharmacy Consult for Eliquis Indication: hx atrial fibrillation, portal vein thrombosis  Patient Measurements: weight 68 kg, height 62 inches Height: _0  (157.5 cm) Weight: 150 lb (68 kg) IBW/kg (Calculated) : 50.1  Vital Signs: Temp: 98.7 F (37.1 C) (01/11 0547) Temp Source: Oral (01/11 0547) BP: 115/41 (01/11 0547) Pulse Rate: 81 (01/11 0547)  Labs: Recent Labs    12/15/18 0212  12/15/18 0253 12/15/18 1341  12/16/18 0147 12/16/18 1218 12/16/18 2111 12/16/18 2307 12/17/18 0459  HGB 8.5*  --   --   --   --  9.5*  --   --  8.8* 8.0*  HCT 25.0*  --   --   --   --  30.2*  --   --  28.1* 25.4*  PLT  --   --   --   --   --  125*  --   --  120* 90*  APTT  --   --  34 59*  --  135*  --   --   --   --   HEPARINUNFRC  --    < > 1.46* 0.88*   < > 0.84* 0.90* 0.61  --   --   CREATININE 2.00*  --   --   --   --  2.56*  --   --   --  2.27*   < > = values in this interval not displayed.    Estimated Creatinine Clearance: 20.3 mL/min (A) (by C-G formula based on SCr of 2.27 mg/dL (H)).   Medications:  - on eliquis 5 mg bid PTA (last dose 12/14/18 at 0900)  Assessment: Patient's a 73 y.o F with hx multiple myeloma, SBP, Afib, and portal vein thrombosis on Eliquis PTA, presented to the ED on 12/15/18 with c/o abd pain.  CT on 12/15/18 showed moderate to large volume of ascites.  To transition anticoagulant from Eliquis to heparin drip in anticipation for paracentesis.   Evening of 1/10 patient developed significant epistaxis which was ultimately controlled with oxymetazoline, packing, and ice. Heparin drip was stopped for this reason overnight.   1/11 AM: orders to transition patient back to Eliquis. Patient is complicated with significant h/o AFIB and now portal vein thrombosis with need for anticoagulation. CHA2DS2/VAS=6. Conversely, patient is at risk of bleeding due to cirrhosis with anemia and thrombocytopenia. Furthermore, patient has acute on chronic  renal failure resulting some uncertainty as to what Eliquis dosing should be. Based on age < 51 and wt > 60 kg, patient is a candidate for Eliquis 5 mg bid.   Goal of Therapy:  Monitor platelets by anticoagulation protocol: Yes   Plan:  - Due to recent bleeding and acute on chronic renal failure combined with cirrhosis and thrombocytopenia, will resume apixaban dosing at 2.5 mg bid.  - Will monitor for bleeding and follow CBC and renal function closely - May consider increasing Eliquis back to PTA dosing of 5 mg bid depending on clinical course  Ulice Dash, PharmD Clinical Pharmacist Pager # [phone]  12/17/2018, 9:44 AM

## 2018-12-18 ENCOUNTER — Encounter (HOSPITAL_COMMUNITY): Payer: Self-pay | Admitting: Internal Medicine

## 2018-12-18 DIAGNOSIS — C9000 Multiple myeloma not having achieved remission: Secondary | ICD-10-CM | POA: Diagnosis present

## 2018-12-18 LAB — RENAL FUNCTION PANEL
ANION GAP: 6 (ref 5–15)
Albumin: 3.9 g/dL (ref 3.5–5.0)
BUN: 33 mg/dL — ABNORMAL HIGH (ref 8–23)
CO2: 21 mmol/L — ABNORMAL LOW (ref 22–32)
Calcium: 8.5 mg/dL — ABNORMAL LOW (ref 8.9–10.3)
Chloride: 111 mmol/L (ref 98–111)
Creatinine, Ser: 2.1 mg/dL — ABNORMAL HIGH (ref 0.44–1.00)
GFR, EST AFRICAN AMERICAN: 27 mL/min — AB (ref >60)
GFR, EST NON AFRICAN AMERICAN: 23 mL/min — AB (ref >60)
Glucose, Bld: 152 mg/dL — ABNORMAL HIGH (ref 70–99)
Phosphorus: 2.8 mg/dL (ref 2.5–4.6)
Potassium: 5.4 mmol/L — ABNORMAL HIGH (ref 3.5–5.1)
Sodium: 138 mmol/L (ref 135–145)

## 2018-12-18 LAB — CBC
HCT: 25.5 % — ABNORMAL LOW (ref 36.0–46.0)
HEMOGLOBIN: 8 g/dL — AB (ref 12.0–15.0)
MCH: 30.8 pg (ref 26.0–34.0)
MCHC: 31.4 g/dL (ref 30.0–36.0)
MCV: 98.1 fL (ref 80.0–100.0)
NRBC: 0 % (ref 0.0–0.2)
Platelets: 96 10*3/uL — ABNORMAL LOW (ref 150–400)
RBC: 2.6 MIL/uL — ABNORMAL LOW (ref 3.87–5.11)
RDW: 18.7 % — ABNORMAL HIGH (ref 11.5–15.5)
WBC: 2.8 10*3/uL — ABNORMAL LOW (ref 4.0–10.5)

## 2018-12-18 LAB — GLUCOSE, CAPILLARY
GLUCOSE-CAPILLARY: 145 mg/dL — AB (ref 70–99)
Glucose-Capillary: 147 mg/dL — ABNORMAL HIGH (ref 70–99)
Glucose-Capillary: 198 mg/dL — ABNORMAL HIGH (ref 70–99)
Glucose-Capillary: 150 mg/dL — ABNORMAL HIGH (ref 70–99)

## 2018-12-18 LAB — POTASSIUM: Potassium: 4.7 mmol/L (ref 3.5–5.1)

## 2018-12-18 MED ORDER — SODIUM POLYSTYRENE SULFONATE 15 GM/60ML PO SUSP
45.0000 g | Freq: Once | ORAL | Status: AC
  Administered 2018-12-18: 45 g via ORAL
  Filled 2018-12-18: qty 180

## 2018-12-18 MED ORDER — ALBUMIN HUMAN 25 % IV SOLN
25.0000 g | Freq: Four times a day (QID) | INTRAVENOUS | Status: AC
  Administered 2018-12-18 – 2018-12-19 (×3): 25 g via INTRAVENOUS
  Filled 2018-12-18 (×4): qty 100

## 2018-12-18 NOTE — Consult Note (Signed)
Consultation  Referring Provider:     Irine Seal, MD Primary Care Physician:  System, Pcp Not In Primary Gastroenterologist:        New to 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 Liver mass at OSH - NL AFP, F/U US no mass and none on noncontrast CT here  Chronic kidney disease with  DM, Htn, myeloma all potential causes - do not think she has hepatorenal syndrome Afib On Eliquis for Afib, PVT(?)  Ascites and fluid overload difficult to control in face of sig reduced renal fx. This is going to be difficult to handle  I suggest we ask nephrology for help Check echocardiogram Hold off on a paracentesis until we get renal input She is waiting to see heme onc as outpatient here but may need there input as inpatient I will do some serologies to look for autoimmune components of liver disease We will f/u here and she can see me as an outpatient  Discussed w/ Dr. Claretta Fraise, MD, [facility] 12/18/2018 3:01 PM Pager [phone]         HPI:   Laura Sampson is a 73 y.o. female admitted with ascites - new dx cirrhosis late 2019 at outside hospitals. Recent C diff colitis tx at [facility] Also recent dx myeloma (? Started chemoTx). Family had her move back here and she was admitted because of abdominal swelling and diarrhea.  When she was seen at the outside hospital in December was when cirrhosis was discovered, at one point they thought there was a mass in the liver but on subsequent ultrasound not so.  She has a pacemaker and could not have an MRI.  No vein thrombosis found as well.  Treated with Eliquis though that may be for A. fib as well.  She has some form of chronic kidney  disease with a creatinine at least in the high 1 range for a while and is been in the twos here.  She was tested for C. difficile and was negative here.  Has had 1 paracentesis which made her feel better but her kidney function bumped and she was treated with fluids and she is gaining abdominal girth again and feels pressure.  Dr. Grandville Silos requested my help in her management.  3.2 liter paracentesis on January 9 with no signs of SBP.  Protein and albumin not tested.  He has been given IV albumin as well.  I have reviewed past medical history through care everywhere as best I can.  At some point she had iron deficiency anemia in the last couple of years and from what she tells me she had EGD and colonoscopy without revealing the cause. presumably no varices but not certain as no records.  INR 1.11 on 12/29 on Eliquis now so would be inaccurate  Also had a mass suspected in liver OSH [place] but not seen on f/u US or CT here Past Medical History:  Diagnosis Date  . CAD (coronary artery disease)   . DM2 (diabetes mellitus, type 2) ([place])   . HTN (hypertension)   . Multiple myeloma not having achieved remission ([place])   . PAF (paroxysmal atrial fibrillation) ([place])   . Portal vein thrombosis   .  Renal disorder    "low functioning" no treatment needed at this time    Past Surgical History:  Procedure Laterality Date  . COLONOSCOPY    . CORONARY ARTERY BYPASS GRAFT    . ESOPHAGOGASTRODUODENOSCOPY    . NASAL SINUS SURGERY    . PACEMAKER IMPLANT  2012   [employer]  . PARTIAL HYSTERECTOMY      Family History  Problem Relation Age of Onset  . Diabetes Mother   . Breast cancer Mother   . Diabetes Father   . Heart disease Father   . Diabetes Sister     Social History   Tobacco Use  . Smoking status: Former Smoker    Years: 4.00    Last attempt to quit: 1983    Years since quitting: 37.0  . Smokeless tobacco: Never Used  Substance Use Topics  . Alcohol use: No  . Drug use: No   Social  History   Social History Narrative   Married, retired from [employer] to [place] - moved back to [place] 11/2018 due to health problems   Former smoker, no EtOH   + children, grandchildren    Prior to Admission medications   Medication Sig Start Date End Date Taking? Authorizing Provider  acetaminophen (TYLENOL) 500 MG tablet Take 1,000 mg by mouth every 6 (six) hours as needed for mild pain, moderate pain, fever or headache.   Yes [provider]  allopurinol (ZYLOPRIM) 100 MG tablet Take 200 mg by mouth daily.   Yes [provider]  apixaban (ELIQUIS) 5 MG TABS tablet Take 5 mg by mouth 2 (two) times daily.   Yes [provider]  bumetanide (BUMEX) 0.5 MG tablet Take 0.5 mg by mouth daily.   Yes [provider]  calcitRIOL (ROCALTROL) 0.25 MCG capsule Take 0.25 mcg by mouth daily.   Yes [provider]  carvedilol (COREG) 3.125 MG tablet Take 3.125 mg by mouth 2 (two) times daily with a meal.   Yes [provider]  digoxin (LANOXIN) 0.125 MG tablet Take 0.125 mg by mouth daily.    Yes [provider]  gabapentin (NEURONTIN) 100 MG capsule Take 100 mg by mouth 2 (two) times daily.   Yes [provider]  hydrALAZINE (APRESOLINE) 25 MG tablet Take 25 mg by mouth 3 (three) times daily.    Yes [provider]  insulin lispro (HUMALOG) 100 UNIT/ML injection Inject 0.16 mLs (16 Units total) into the skin 3 (three) times daily before meals. Patient taking differently: Inject 8 Units into the skin 3 (three) times daily before meals.  08/23/17  Yes Maczis, Barth Kirks, PA-C  insulin NPH Human (HUMULIN N,NOVOLIN N) 100 UNIT/ML injection Inject 45 Units into the skin 2 (two) times daily. 12/04/18 02/09/19 Yes [provider]  levothyroxine (SYNTHROID, LEVOTHROID) 75 MCG tablet Take 75 mcg by mouth daily before breakfast.   Yes [provider]  magnesium oxide (MAG-OX) 400 MG tablet Take 400 mg by  mouth daily.   Yes [provider]  sodium bicarbonate 650 MG tablet Take 650 mg by mouth 2 (two) times daily.   Yes [provider]  spironolactone (ALDACTONE) 25 MG tablet Take 25 mg by mouth daily.   Yes [provider]    Current Facility-Administered Medications  Medication Dose Route Frequency Provider Last Rate Last Dose  . acetaminophen (TYLENOL) tablet 650 mg  650 mg Oral Q6H PRN Etta Quill, DO   650 mg at  12/18/18 0826   Or  . acetaminophen (TYLENOL) suppository 650 mg  650 mg Rectal Q6H PRN Etta Quill, DO      . albumin human 25 % solution 25 g  25 g Intravenous Q6H Eugenie Filler, MD 60 mL/hr at 12/18/18 1336 25 g at 12/18/18 1336  . allopurinol (ZYLOPRIM) tablet 200 mg  200 mg Oral Daily Jennette Kettle M, DO   200 mg at 12/18/18 1022  . apixaban (ELIQUIS) tablet 2.5 mg  2.5 mg Oral BID Eugenie Filler, MD   2.5 mg at 12/18/18 1012  . calcitRIOL (ROCALTROL) capsule 0.25 mcg  0.25 mcg Oral Daily Jennette Kettle M, DO   0.25 mcg at 12/18/18 1022  . carvedilol (COREG) tablet 3.125 mg  3.125 mg Oral BID WC Jennette Kettle M, DO   3.125 mg at 12/18/18 8280  . digoxin (LANOXIN) tablet 0.125 mg  0.125 mg Oral Daily Jennette Kettle M, DO   0.125 mg at 12/18/18 1011  . gabapentin (NEURONTIN) capsule 100 mg  100 mg Oral BID Jennette Kettle M, DO   100 mg at 12/18/18 1012  . hydrALAZINE (APRESOLINE) tablet 25 mg  25 mg Oral TID Etta Quill, DO   25 mg at 12/18/18 1012  . insulin aspart (novoLOG) injection 0-9 Units  0-9 Units Subcutaneous TID WC Eugenie Filler, MD   1 Units at 12/18/18 1315  . levothyroxine (SYNTHROID, LEVOTHROID) tablet 75 mcg  75 mcg Oral QAC breakfast Etta Quill, DO   75 mcg at 12/18/18 0349  . loperamide (IMODIUM) capsule 2 mg  2 mg Oral PRN Eugenie Filler, MD   2 mg at 12/16/18 1143  . magnesium oxide (MAG-OX) tablet 400 mg  400 mg Oral Daily Jennette Kettle M, DO   400 mg at 12/18/18 1012  . ondansetron (ZOFRAN)  tablet 4 mg  4 mg Oral Q6H PRN Etta Quill, DO       Or  . ondansetron [facility]) injection 4 mg  4 mg Intravenous Q6H PRN Etta Quill, DO      . oxyCODONE (Oxy IR/ROXICODONE) immediate release tablet 5 mg  5 mg Oral Q4H PRN Eugenie Filler, MD   5 mg at 12/17/18 1243  . sodium bicarbonate tablet 650 mg  650 mg Oral BID Etta Quill, DO   650 mg at 12/18/18 1011    Allergies as of 12/14/2018 - Review Complete 12/14/2018  Allergen Reaction Noted  . Penicillins Rash 08/23/2017  . Clindamycin/lincomycin Itching 08/23/2017     Review of Systems:    This is positive for those things mentioned in the HPI All other review of systems are negative.       Physical Exam:  Vital signs in last 24 hours: Temp:  [98.2 F (36.8 C)-98.9 F (37.2 C)] 98.5 F (36.9 C) (01/12 1441) Pulse Rate:  [73-79] 79 (01/12 1441) Resp:  [16-20] 16 (01/12 1441) BP: (122-156)/(58-60) 156/58 (01/12 1441) SpO2:  [97 %-100 %] 100 % (01/12 1441) Last BM Date: 12/18/18  General:  Elderly bw NAD but looks mildly chronically ill Eyes:  anicteric. ENT:   Mouth and posterior pharynx free of lesions.  Neck:   supple w/o thyromegaly or mass.  Lungs: Decreased BS bases. Heart:   S1S2 NL rate - irregh beats 2/6 sys murmur Abdomen:  soft, distended w/ mod large ascites non-tender, no hepatosplenomegaly, hernia, or mass and BS+.  Lymph:  no cervical or supraclavicular adenopathy. Extremities:   no  edema in LE + mild presacral edema Skin   no rash. Neuro:  A&O x 3. No asterixis Psych:  appropriate mood and  Affect.   Data Reviewed:   LAB RESULTS: Recent Labs    12/16/18 2307 12/17/18 0459 12/18/18 0415  WBC 3.2* 2.7* 2.8*  HGB 8.8* 8.0* 8.0*  HCT 28.1* 25.4* 25.5*  PLT 120* 90* 96*   BMET Recent Labs    12/16/18 0147 12/17/18 0459 12/18/18 0415 12/18/18 1344  NA 139 138 138  --   K 4.3 4.5 5.4* 4.7  CL 110 109 111  --   CO2 22 22 21*  --   GLUCOSE 106* 179* 152*  --   BUN 43*  36* 33*  --   CREATININE 2.56* 2.27* 2.10*  --   CALCIUM 8.5* 8.4* 8.5*  --    LFT Recent Labs    12/17/18 0459 12/18/18 0415  PROT 6.9  --   ALBUMIN 3.6 3.9  AST 160*  --   ALT 18  --   ALKPHOS 290*  --   BILITOT 0.6  --    PT/INR No results for input(s): LABPROT, INR in the last 72 hours.  STUDIES: US Renal  Result Date: 12/16/2018 CLINICAL DATA:  Acute renal failure. EXAM: RENAL / URINARY TRACT ULTRASOUND COMPLETE COMPARISON:  12/15/2018. FINDINGS: Right Kidney: Renal measurements: 10.6 x 4.1 x 5.1 cm = volume: 117.8 mL. Increased echogenicity. No mass or hydronephrosis visualized. Left Kidney: Renal measurements: 9.9 x 4.8 x 5.0 cm = volume: 125.2 mL. Small amount of perinephric fluid noted. Increased echogenicity. No mass or hydronephrosis visualized. Bladder: Appears normal for degree of bladder distention. Incidental note is made of mild ascites. IMPRESSION: 1. Increased renal echogenicity noted bilaterally. Chronic medical renal disease could present this fashion. Small amount of left perinephric fluid noted. Active renal pathology including pyelonephritis can not be excluded. No acute renal abnormality otherwise noted. No hydronephrosis or bladder distention. 2.  Mild ascites. Electronically Signed   By: Marcello Moores  Register   On: 12/16/2018 17:22   CT abd/pelvis no contrast 12/15/2018  IMPRESSION: 1. Moderate to large volume of ascites with soft tissue anasarca. 2. Cirrhotic appearing liver without definite space-occupying mass given limitations of a noncontrast study. 3. Uncomplicated cholelithiasis. 4. Aortoiliac atherosclerosis.   Thanks   LOS: 2 days   @Carl  Simonne Maffucci, MD, [facility] @  12/18/2018, 2:49 PM

## 2018-12-18 NOTE — Progress Notes (Addendum)
PROGRESS NOTE    Laura Sampson  RKY:[id number] DOB: [birth year] DOA: 12/14/2018 PCP: System, Pcp Not In    Brief Narrative:  Per [name] is a 73 y.o. female with medical history significant of DM2, HTN, CAD s/p CABG, PPM.  Recent diagnosis of MM.  Patient recently hospitalized in OSH at end of December for abd pain, swelling, diarrhea.  Work up during hospital stay showed cirrhotic appearance of liver, ascites, though not enough ascites apparently to tap when they tried.  Stool came back positive for C.Diff.  Patient was started on PO vanc, bumex, and aldactone.  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  Also saw a 2.3cm right lobe hepatic mass on [place] 12/21, though the same mass couldn't be visualized on repeat US x5 days later on the 26th.   ED Course: ALK 454, Creat 2.0 AST 210 ALT 27 (about what they had been running at OSH).  CT abd/pelvis shows: Moderate to large volume of ascites with soft tissue anasarca.    Assessment & Plan:   Principal Problem:   Clostridium difficile diarrhea Active Problems:   Myeloma ([place])   Liver mass   Cirrhosis (HCC)   Ascites   DM2 (diabetes mellitus, type 2) (HCC)   HTN (hypertension)   PAF (paroxysmal atrial fibrillation) (HCC)   CKD (chronic kidney disease) stage 4, GFR 15-29 ml/min (HCC)  1 diarrhea/recent diagnosis of C. difficile colitis at outside hospital Patient noted to have recently been diagnosed with a C. difficile colitis at outside hospital.  Patient still with complaints of multiple loose stools.  C. difficile PCR was repeated which was negative.  Pharmacy noted that patient has 12/15/2018 had had a full 14-day course of treatment for C. difficile colitis.  Oral vancomycin has been discontinued.  Bumex and spironolactone on hold.  GI pathogen panel negative.  Imodium as needed. Follow.  2.  Cirrhosis with ascites/abdominal pain Patient status post ultrasound-guided paracentesis with 3.2 L  of hazy fluid removed.  Gram stain and cultures negative to date.  Doubt if patient has SBP.  Patient with some improvement with moderate abdominal discomfort post paracentesis.  Patient feels fluid is building back up.  Urinalysis nitrite negative, leukocytes negative, protein negative.  Bumex and spironolactone on hold due to probable dehydration from multiple episodes of diarrhea and slight bump in creatinine.  Acute hepatitis panel is negative.  Patient status post IV albumin x1 day.  We will give another dose of IV albumin for another 24 hours.  Check a 2D echo.  Due to patient's complicated medical history of cirrhosis with ascites/anasarca, portal vein thrombosis will consult with gastroenterology for further evaluation and management.  3.  Diabetes mellitus type 2 Patient noted to have a hypoglycemic episode with a CBG of 34 on 12/16/2018, which improved after orange juice and packets of sugar.  Hemoglobin A1c 8.7.  CBG of 147 this morning.  70/30 has been discontinued.  Continue sliding scale insulin.    4.  Chronic kidney disease stage IV Baseline creatinine approximately 2.  Creatinine currently at 2.10 from 2.27 from 2.56.  Urinalysis negative for proteinuria.  Patient with slight bump in creatinine on 12/17/2018, may be secondary to prerenal azotemia from multiple episodes of diarrhea which is trending back down..  Continue to hold diuretics of Bumex and spironolactone.  Continue calcitriol.  Continue gentle hydration for another 24 hours.  Will consult with nephrology tomorrow for further evaluation and management as patient with cirrhosis and  ascites as well as history of recently diagnosed multiple myeloma.   5.??  Liver mass Initial concern for 2.3 cm right lobe mass concerning for neoplasm seen on right upper quadrant ultrasound 11/26/2018.  Repeat ultrasound 5 days later could not see a mass.  Patient unable to have a MRI due to PPM.  CT abdomen and pelvis done during this hospitalization  without contrast with no mention of a liver mass.  Acute hepatitis panel negative.  Consult with GI for further evaluation and management.   6.  Multiple myeloma Outpatient follow-up with oncology.  7.  Paroxysmal atrial fibrillation Continue Coreg and digoxin.  Eliquis was held and patient placed on a heparin drip in anticipation of ultrasound-guided paracentesis.  Patient status post paracentesis.  Heparin drip discontinued as patient noted to have a bout of epistaxis the night of 12/16/2018.  Resumed Eliquis at 2.5 mg twice daily as dosed per pharmacy due to patient's renal function and history of cirrhosis.    8.  Hypertension Blood pressure stable.  Continue Coreg.  Bumex and spironolactone on hold.  9.  Hyperkalemia Kayexalate x1.  Repeat potassium.  Follow.   DVT prophylaxis: Heparin Code Status: Full Family Communication: Updated patient and husband and daughter and son at bedside. Disposition Plan: Home when clinically improved with improvement in renal function.   Consultants:   None  Procedures:   Ultrasound-guided paracentesis--3.2 L of hazy amber fluid removed per interventional radiology 12/15/2018  CT renal stone protocol 12/14/2018    Antimicrobials:   None   Subjective: Patient going back and forth to the bathroom and she just received some Kayexalate.  Feels her abdomen is getting distended again from ascitic fluid.  Denies any nausea or emesis.  Prior to receiving Kayexalate diarrhea had improved.  No chest pain.  No shortness of breath.  No further nasal bleeding.    Objective: Vitals:   12/17/18 0547 12/17/18 1427 12/17/18 2138 12/18/18 0605  BP: (!) 115/41 (!) 116/57 (!) 126/58 122/60  Pulse: 81 74 73 77  Resp: 18 17 20 18   Temp: 98.7 F (37.1 C) 98.3 F (36.8 C) 98.9 F (37.2 C) 98.2 F (36.8 C)  TempSrc: Oral Oral Oral Oral  SpO2: 100% 93% 99% 97%  Weight:      Height:        Intake/Output Summary (Last 24 hours) at 12/18/2018 1248 Last  data filed at 12/18/2018 0800 Gross per 24 hour  Intake 2459.29 ml  Output 950 ml  Net 1509.29 ml   Filed Weights   12/15/18 0321  Weight: 68 kg    Examination:  General exam: NAD. Respiratory system: Decreased breath sounds in the bases otherwise clear.  No wheezes, no crackles, no rhonchi. Respiratory effort normal. Cardiovascular system: RRR no murmurs rubs or gallops.  No JVD.  No lower extremity edema.  Gastrointestinal system: Abdomen is mildly distended, soft, tender to palpation in the epigastrium and right upper quadrant, positive bowel sounds.  No rebound.  No guarding.  Central nervous system: Alert and oriented. No focal neurological deficits. Extremities: Symmetric 5 x 5 power. Skin: No rashes, lesions or ulcers Psychiatry: Judgement and insight appear normal. Mood & affect appropriate.     Data Reviewed: I have personally reviewed following labs and imaging studies  CBC: Recent Labs  Lab 12/14/18 2313 12/15/18 0212 12/16/18 0147 12/16/18 2307 12/17/18 0459 12/18/18 0415  WBC 4.5  --  3.4* 3.2* 2.7* 2.8*  NEUTROABS 3.3  --   --   --   --   --  HGB 10.1* 8.5* 9.5* 8.8* 8.0* 8.0*  HCT 31.9* 25.0* 30.2* 28.1* 25.4* 25.5*  MCV 96.7  --  95.6 94.6 96.2 98.1  PLT 134*  --  125* 120* 90* 96*   Basic Metabolic Panel: Recent Labs  Lab 12/15/18 0212 12/16/18 0147 12/17/18 0459 12/18/18 0415  NA 142 139 138 138  K 4.8 4.3 4.5 5.4*  CL 117* 110 109 111  CO2  --  22 22 21*  GLUCOSE 132* 106* 179* 152*  BUN 50* 43* 36* 33*  CREATININE 2.00* 2.56* 2.27* 2.10*  CALCIUM  --  8.5* 8.4* 8.5*  MG  --  2.1  --   --   PHOS  --   --   --  2.8   GFR: Estimated Creatinine Clearance: 21.9 mL/min (A) (by C-G formula based on SCr of 2.1 mg/dL (H)). Liver Function Tests: Recent Labs  Lab 12/14/18 2313 12/16/18 0147 12/17/18 0459 12/18/18 0415  AST 210* 176* 160*  --   ALT 27 21 18   --   ALKPHOS 454* 359* 290*  --   BILITOT 0.7 0.6 0.6  --   PROT 8.2* 6.9 6.9   --   ALBUMIN 3.0* 2.4* 3.6 3.9   Recent Labs  Lab 12/14/18 2313  LIPASE 27   No results for input(s): AMMONIA in the last 168 hours. Coagulation Profile: No results for input(s): INR, PROTIME in the last 168 hours. Cardiac Enzymes: No results for input(s): CKTOTAL, CKMB, CKMBINDEX, TROPONINI in the last 168 hours. BNP (last 3 results) No results for input(s): PROBNP in the last 8760 hours. HbA1C: Recent Labs    12/16/18 0147  HGBA1C 8.7*   CBG: Recent Labs  Lab 12/17/18 0808 12/17/18 1201 12/17/18 1620 12/17/18 2135 12/18/18 0732  GLUCAP 154* 170* 195* 187* 147*   Lipid Profile: No results for input(s): CHOL, HDL, LDLCALC, TRIG, CHOLHDL, LDLDIRECT in the last 72 hours. Thyroid Function Tests: No results for input(s): TSH, T4TOTAL, FREET4, T3FREE, THYROIDAB in the last 72 hours. Anemia Panel: No results for input(s): VITAMINB12, FOLATE, FERRITIN, TIBC, IRON, RETICCTPCT in the last 72 hours. Sepsis Labs: No results for input(s): PROCALCITON, LATICACIDVEN in the last 168 hours.  Recent Results (from the past 240 hour(s))  Gastrointestinal Panel by PCR , Stool     Status: None   Collection Time: 12/15/18 11:11 AM  Result Value Ref Range Status   Campylobacter species NOT DETECTED NOT DETECTED Final   Plesimonas shigelloides NOT DETECTED NOT DETECTED Final   Salmonella species NOT DETECTED NOT DETECTED Final   Yersinia enterocolitica NOT DETECTED NOT DETECTED Final   Vibrio species NOT DETECTED NOT DETECTED Final   Vibrio cholerae NOT DETECTED NOT DETECTED Final   Enteroaggregative E coli (EAEC) NOT DETECTED NOT DETECTED Final   Enteropathogenic E coli (EPEC) NOT DETECTED NOT DETECTED Final   Enterotoxigenic E coli (ETEC) NOT DETECTED NOT DETECTED Final   Shiga like toxin producing E coli (STEC) NOT DETECTED NOT DETECTED Final   Shigella/Enteroinvasive E coli (EIEC) NOT DETECTED NOT DETECTED Final   Cryptosporidium NOT DETECTED NOT DETECTED Final   Cyclospora  cayetanensis NOT DETECTED NOT DETECTED Final   Entamoeba histolytica NOT DETECTED NOT DETECTED Final   Giardia lamblia NOT DETECTED NOT DETECTED Final   Adenovirus F40/41 NOT DETECTED NOT DETECTED Final   Astrovirus NOT DETECTED NOT DETECTED Final   Norovirus GI/GII NOT DETECTED NOT DETECTED Final   Rotavirus A NOT DETECTED NOT DETECTED Final   Sapovirus (I, II, IV, and V)  NOT DETECTED NOT DETECTED Final    Comment: Performed at [facility], [address] [place], [place] [address]  C difficile quick scan w PCR reflex     Status: None   Collection Time: 12/15/18 11:11 AM  Result Value Ref Range Status   C Diff antigen NEGATIVE NEGATIVE Final   C Diff toxin NEGATIVE NEGATIVE Final   C Diff interpretation No C. difficile detected.  Final    Comment: Performed at [facility], [place] [address] [place], [place] [address]  Culture, body fluid-bottle     Status: None (Preliminary result)   Collection Time: 12/15/18  3:58 PM  Result Value Ref Range Status   Specimen Description FLUID PERITONEAL  Final   Special Requests BOTTLES DRAWN AEROBIC AND ANAEROBIC  Final   Culture   Final    NO GROWTH 3 DAYS Performed at [facility], [place] [address], [place], [place] [address]    Report Status PENDING  Incomplete  Gram stain     Status: None   Collection Time: 12/15/18  3:58 PM  Result Value Ref Range Status   Specimen Description FLUID PERITONEAL  Final   Special Requests NONE  Final   Gram Stain   Final    CYTOSPIN SMEAR NO WBC SEEN NO ORGANISMS SEEN Performed at [facility], [address], [place], [place] [address]    Report Status 12/16/2018 FINAL  Final  Culture, Urine     Status: Abnormal   Collection Time: 12/16/18  6:38 AM  Result Value Ref Range Status   Specimen Description URINE, CLEAN CATCH  Final   Special Requests   Final    NONE Performed at [facility] [address], [place], [place] [address]    Culture  (A)  Final    80,000 COLONIES/mL MULTIPLE SPECIES PRESENT, SUGGEST RECOLLECTION   Report Status 12/17/2018 FINAL  Final         Radiology Studies: US Renal  Result Date: 12/16/2018 CLINICAL DATA:  Acute renal failure. EXAM: RENAL / URINARY TRACT ULTRASOUND COMPLETE COMPARISON:  12/15/2018. FINDINGS: Right Kidney: Renal measurements: 10.6 x 4.1 x 5.1 cm = volume: 117.8 mL. Increased echogenicity. No mass or hydronephrosis visualized. Left Kidney: Renal measurements: 9.9 x 4.8 x 5.0 cm = volume: 125.2 mL. Small amount of perinephric fluid noted. Increased echogenicity. No mass or hydronephrosis visualized. Bladder: Appears normal for degree of bladder distention. Incidental note is made of mild ascites. IMPRESSION: 1. Increased renal echogenicity noted bilaterally. Chronic medical renal disease could present this fashion. Small amount of left perinephric fluid noted. Active renal pathology including pyelonephritis can not be excluded. No acute renal abnormality otherwise noted. No hydronephrosis or bladder distention. 2.  Mild ascites. Electronically Signed   By: Marcello Moores  Register   On: 12/16/2018 17:22        Scheduled Meds: . allopurinol  200 mg Oral Daily  . apixaban  2.5 mg Oral BID  . calcitRIOL  0.25 mcg Oral Daily  . carvedilol  3.125 mg Oral BID WC  . digoxin  0.125 mg Oral Daily  . gabapentin  100 mg Oral BID  . hydrALAZINE  25 mg Oral TID  . insulin aspart  0-9 Units Subcutaneous TID WC  . levothyroxine  75 mcg Oral QAC breakfast  . magnesium oxide  400 mg Oral Daily  . sodium bicarbonate  650 mg Oral BID   Continuous Infusions:    LOS: 2 days  Time spent: 35 minutes    Irine Seal, MD [facility]  If 7PM-7AM, please contact night-coverage [email or web address] 12/18/2018, 12:48 PM

## 2018-12-18 NOTE — Progress Notes (Signed)
Occupational Therapy Evaluation Patient Details Name: Laura Sampson MRN: [id number] DOB: May 13, 1946 Today's Date: 12/18/2018    History of Present Illness 73 year old female history of type 2 diabetes, hypertension, coronary artery disease status post CABG, status post PPM, recent diagnosis of multiple myeloma, chronic kidney disease and admitted for Clostridium difficile diarrhea and pt also with ascites and R lobe hepatic mass.  S/p paracentesis 12/15/2018   Clinical Impression   Limited OT evaluation completed. No OT needs identified. Will sign off.    Follow Up Recommendations  No OT follow up    Equipment Recommendations  None recommended by OT    Recommendations for Other Services       Precautions / Restrictions Precautions Precautions: Fall Restrictions Weight Bearing Restrictions: No      Mobility Bed Mobility                  Transfers                      Balance                                           ADL either performed or assessed with clinical judgement   ADL Overall ADL's : Needs assistance/impaired                                       General ADL Comments: Patient reports independence with feeding, grooming, toileting, bathing, and dressing. Nurse tech confirms. Patient does report legs feel week since admission. Recommended use of RW in room. Patient's husband also states that they have a RW at home.     Vision         Perception     Praxis      Pertinent Vitals/Pain Pain Assessment: 0-10 Pain Score: 6  Pain Location: abdomen Pain Descriptors / Indicators: Aching;Pressure Pain Intervention(s): Monitored during session;Premedicated before session     Hand Dominance     Extremity/Trunk Assessment Upper Extremity Assessment Upper Extremity Assessment: Overall WFL for tasks assessed   Lower Extremity Assessment Lower Extremity Assessment: Generalized weakness(per patient report,  legs have been weak since admission)       Communication Communication Communication: No difficulties   Cognition Arousal/Alertness: Awake/alert Behavior During Therapy: WFL for tasks assessed/performed Overall Cognitive Status: Within Functional Limits for tasks assessed                                     General Comments       Exercises     Shoulder Instructions      Home Living Family/patient expects to be discharged to:: Private residence Living Arrangements: Children Available Help at Discharge: Family Type of Home: Apartment Home Access: Stairs to enter Technical brewer of Steps: 12 Entrance Stairs-Rails: Right Home Layout: One level     Bathroom Shower/Tub: Tub/shower unit         Home Equipment: Environmental consultant - 2 wheels;Cane - single point   Additional Comments: pt reports being from a town near [place] and visiting family however plans to return to daughter's apt in [place] upon d/c (as above)      Prior Functioning/Environment Level of Independence: Independent  OT Problem List: Decreased strength;Decreased activity tolerance      OT Treatment/Interventions:      OT Goals(Current goals can be found in the care plan section) Acute Rehab OT Goals Patient Stated Goal: to home OT Goal Formulation: All assessment and education complete, DC therapy  OT Frequency:     Barriers to D/C:            Co-evaluation              AM-PAC OT "6 Clicks" Daily Activity     Outcome Measure Help from another person eating meals?: None Help from another person taking care of personal grooming?: None Help from another person toileting, which includes using toliet, bedpan, or urinal?: A Little Help from another person bathing (including washing, rinsing, drying)?: None Help from another person to put on and taking off regular upper body clothing?: None   6 Click Score: 19   End of Session Equipment Utilized  During Treatment: Rolling walker Nurse Communication: Mobility status  Activity Tolerance: Other (comment)(pt in process of drinking medicine for bowel movement) Patient left: in bed;with call bell/phone within reach;with family/visitor present  OT Visit Diagnosis: Unsteadiness on feet (R26.81)                Time: 1020-1035 OT Time Calculation (min): 15 min Charges:  OT General Charges $OT Visit: 1 Visit OT Evaluation $OT Eval Low Complexity: 1 Low    Myonna Chisom A Tasha Jindra 12/18/2018, 12:19 PM

## 2018-12-19 ENCOUNTER — Other Ambulatory Visit: Payer: Self-pay

## 2018-12-19 ENCOUNTER — Inpatient Hospital Stay (HOSPITAL_COMMUNITY): Payer: Medicare Other

## 2018-12-19 DIAGNOSIS — R040 Epistaxis: Secondary | ICD-10-CM

## 2018-12-19 DIAGNOSIS — I370 Nonrheumatic pulmonary valve stenosis: Secondary | ICD-10-CM

## 2018-12-19 DIAGNOSIS — R0789 Other chest pain: Secondary | ICD-10-CM

## 2018-12-19 DIAGNOSIS — N179 Acute kidney failure, unspecified: Secondary | ICD-10-CM

## 2018-12-19 DIAGNOSIS — I340 Nonrheumatic mitral (valve) insufficiency: Secondary | ICD-10-CM

## 2018-12-19 LAB — CBC
HCT: 24.2 % — ABNORMAL LOW (ref 36.0–46.0)
Hemoglobin: 7.6 g/dL — ABNORMAL LOW (ref 12.0–15.0)
MCH: 31 pg (ref 26.0–34.0)
MCHC: 31.4 g/dL (ref 30.0–36.0)
MCV: 98.8 fL (ref 80.0–100.0)
NRBC: 0 % (ref 0.0–0.2)
PLATELETS: 95 10*3/uL — AB (ref 150–400)
RBC: 2.45 MIL/uL — ABNORMAL LOW (ref 3.87–5.11)
RDW: 18.9 % — AB (ref 11.5–15.5)
WBC: 2.8 10*3/uL — ABNORMAL LOW (ref 4.0–10.5)

## 2018-12-19 LAB — COMPREHENSIVE METABOLIC PANEL
ALT: 14 U/L (ref 0–44)
ANION GAP: 9 (ref 5–15)
AST: 173 U/L — ABNORMAL HIGH (ref 15–41)
Albumin: 4.6 g/dL (ref 3.5–5.0)
Alkaline Phosphatase: 286 U/L — ABNORMAL HIGH (ref 38–126)
BUN: 33 mg/dL — ABNORMAL HIGH (ref 8–23)
CO2: 20 mmol/L — ABNORMAL LOW (ref 22–32)
Calcium: 9 mg/dL (ref 8.9–10.3)
Chloride: 112 mmol/L — ABNORMAL HIGH (ref 98–111)
Creatinine, Ser: 2.22 mg/dL — ABNORMAL HIGH (ref 0.44–1.00)
GFR, EST AFRICAN AMERICAN: 25 mL/min — AB (ref >60)
GFR, EST NON AFRICAN AMERICAN: 21 mL/min — AB (ref >60)
Glucose, Bld: 199 mg/dL — ABNORMAL HIGH (ref 70–99)
Potassium: 4.8 mmol/L (ref 3.5–5.1)
Sodium: 141 mmol/L (ref 135–145)
Total Bilirubin: 0.9 mg/dL (ref 0.3–1.2)
Total Protein: 7.8 g/dL (ref 6.5–8.1)

## 2018-12-19 LAB — HEMOGLOBIN AND HEMATOCRIT, BLOOD
HCT: 22.4 % — ABNORMAL LOW (ref 36.0–46.0)
Hemoglobin: 7.1 g/dL — ABNORMAL LOW (ref 12.0–15.0)

## 2018-12-19 LAB — PROTIME-INR
INR: 1.41
Prothrombin Time: 17.1 seconds — ABNORMAL HIGH (ref 11.4–15.2)

## 2018-12-19 LAB — ECHOCARDIOGRAM COMPLETE
Height: 62 in
Weight: 2400 oz

## 2018-12-19 LAB — PREPARE RBC (CROSSMATCH)

## 2018-12-19 LAB — GLUCOSE, CAPILLARY
GLUCOSE-CAPILLARY: 207 mg/dL — AB (ref 70–99)
GLUCOSE-CAPILLARY: 186 mg/dL — AB (ref 70–99)
Glucose-Capillary: 256 mg/dL — ABNORMAL HIGH (ref 70–99)
Glucose-Capillary: 203 mg/dL — ABNORMAL HIGH (ref 70–99)

## 2018-12-19 LAB — TROPONIN I
Troponin I: 0.04 ng/mL (ref <0.03)
Troponin I: 0.03 ng/mL (ref <0.03)
Troponin I: 0.03 ng/mL (ref <0.03)

## 2018-12-19 LAB — ABO/RH: ABO/RH(D): A POS

## 2018-12-19 MED ORDER — DIPHENHYDRAMINE HCL 25 MG PO CAPS
25.0000 mg | ORAL_CAPSULE | Freq: Once | ORAL | Status: AC
  Administered 2018-12-19: 25 mg via ORAL
  Filled 2018-12-19: qty 1

## 2018-12-19 MED ORDER — SODIUM CHLORIDE 0.9% IV SOLUTION
Freq: Once | INTRAVENOUS | Status: AC
  Administered 2018-12-19: 17:00:00 via INTRAVENOUS

## 2018-12-19 MED ORDER — DOXYCYCLINE HYCLATE 100 MG PO TABS
100.0000 mg | ORAL_TABLET | Freq: Two times a day (BID) | ORAL | Status: DC
  Administered 2018-12-19 – 2018-12-22 (×6): 100 mg via ORAL
  Filled 2018-12-19 (×6): qty 1

## 2018-12-19 MED ORDER — FUROSEMIDE 40 MG PO TABS
40.0000 mg | ORAL_TABLET | Freq: Every day | ORAL | Status: DC
  Administered 2018-12-20: 40 mg via ORAL
  Filled 2018-12-19: qty 1

## 2018-12-19 MED ORDER — FUROSEMIDE 10 MG/ML IJ SOLN
20.0000 mg | Freq: Once | INTRAMUSCULAR | Status: AC
  Administered 2018-12-19: 20 mg via INTRAVENOUS
  Filled 2018-12-19: qty 2

## 2018-12-19 MED ORDER — OXYMETAZOLINE HCL 0.05 % NA SOLN
3.0000 | Freq: Two times a day (BID) | NASAL | Status: DC
  Filled 2018-12-19: qty 15

## 2018-12-19 MED ORDER — FUROSEMIDE 10 MG/ML IJ SOLN: 20.0000 mg | Freq: Once | INTRAMUSCULAR | Status: DC

## 2018-12-19 MED ORDER — ACETAMINOPHEN 325 MG PO TABS
650.0000 mg | ORAL_TABLET | Freq: Once | ORAL | Status: AC
  Administered 2018-12-19: 650 mg via ORAL
  Filled 2018-12-19: qty 2

## 2018-12-19 MED ORDER — TRANEXAMIC ACID 1000 MG/10ML IV SOLN
500.0000 mg | Freq: Once | INTRAVENOUS | Status: AC
  Administered 2018-12-19: 500 mg via TOPICAL
  Filled 2018-12-19: qty 10

## 2018-12-19 MED ORDER — PHENYLEPHRINE HCL 1 % NA SOLN
1.0000 [drp] | Freq: Four times a day (QID) | NASAL | Status: DC | PRN
  Administered 2018-12-19: 1 [drp] via NASAL
  Filled 2018-12-19: qty 15

## 2018-12-19 MED ORDER — SODIUM CHLORIDE 0.9 % IV SOLN
510.0000 mg | Freq: Once | INTRAVENOUS | Status: AC
  Administered 2018-12-19: 510 mg via INTRAVENOUS
  Filled 2018-12-19: qty 17

## 2018-12-19 MED ORDER — OXYMETAZOLINE HCL 0.05 % NA SOLN
3.0000 | NASAL | Status: DC | PRN
  Filled 2018-12-19: qty 15

## 2018-12-19 MED ORDER — FUROSEMIDE 40 MG PO TABS: 40.0000 mg | ORAL_TABLET | Freq: Every day | ORAL | Status: DC

## 2018-12-19 MED ORDER — PANTOPRAZOLE SODIUM 40 MG IV SOLR
40.0000 mg | Freq: Two times a day (BID) | INTRAVENOUS | Status: DC
  Administered 2018-12-19: 40 mg via INTRAVENOUS
  Filled 2018-12-19: qty 40

## 2018-12-19 NOTE — Progress Notes (Signed)
PT Cancellation Note  Patient Details Name: Laura Sampson MRN: [id number] DOB: [birth year]   Cancelled Treatment:    Reason Eval/Treat Not Completed: Medical issues which prohibited therapy; pt with persistent epistaxis this am, just seen by ED physician for nasal packing to try and stop bleeding; will defer mobility at this time;    [facility] 12/19/2018, 11:29 AM

## 2018-12-19 NOTE — Progress Notes (Signed)
Called to the bedside by RN with concerns for ongoing nosebleed. According to RN, pt is experiencing epistaxis for over 2 hours now. Attempted packing and using afrin nasal spray but bleeding has continued. The issue has lessened over the coarse of an hour but the left nare still continues to bleed. On assessment, pt has packing in left nostril. She states that this has been an issue before but not for this long. She complains of fatigues and lightheadedness. VSS. Since the amount of blood has lessened we will try neo-synephrine nasal spray and continue to monitor.   Epistaxis - Afrin nasal spray and gauze packing - Neo-synephrine nasal spray - Consider ENT consult and cauterization if nosebleed continues - Hold blood thinners for now - CBC ordered. Transfuse if less than 7.   Lovey Newcomer, NP  [facility] 7p-7a [phone]

## 2018-12-19 NOTE — Progress Notes (Signed)
[facility] Progress Note    Since last GI note: Nose bleed started overnight, ongoing. She is swallowing blood, vomiting blood clots.  Objective: Vital signs in last 24 hours: Temp:  [98.5 F (36.9 C)-99.2 F (37.3 C)] 99.2 F (37.3 C) (01/13 0610) Pulse Rate:  [79-85] 85 (01/13 0610) Resp:  [15-18] 15 (01/13 0610) BP: (135-156)/(57-68) 154/57 (01/13 0646) SpO2:  [97 %-100 %] 97 % (01/13 0610) Last BM Date: 12/18/18 General: alert and oriented times 3 Heart: regular rate and rythm Abdomen: soft, non-tender, non-distended, normal bowel sounds   Lab Results: Recent Labs    12/17/18 0459 12/18/18 0415 12/19/18 0423  WBC 2.7* 2.8* 2.8*  HGB 8.0* 8.0* 7.6*  PLT 90* 96* 95*  MCV 96.2 98.1 98.8   Recent Labs    12/17/18 0459 12/18/18 0415 12/18/18 1344 12/19/18 0423  NA 138 138  --  141  K 4.5 5.4* 4.7 4.8  CL 109 111  --  112*  CO2 22 21*  --  20*  GLUCOSE 179* 152*  --  199*  BUN 36* 33*  --  33*  CREATININE 2.27* 2.10*  --  2.22*  CALCIUM 8.4* 8.5*  --  9.0   Recent Labs    12/17/18 0459 12/18/18 0415 12/19/18 0423  PROT 6.9  --  7.8  ALBUMIN 3.6 3.9 4.6  AST 160*  --  173*  ALT 18  --  14  ALKPHOS 290*  --  286*  BILITOT 0.6  --  0.9   Recent Labs    12/19/18 0815  INR 1.41     Studies/Results: Dg Chest Port 1 View  Result Date: 12/19/2018 CLINICAL DATA:  Chest tightness. EXAM: PORTABLE CHEST 1 VIEW COMPARISON:  11/30/2017 FINDINGS: Decreased lung volumes compared to the previous examination. Hazy densities in the left lower chest. Upper lungs are clear. Heart size remains enlarged with evidence of previous cardiac surgery. Stable appearance of the right-sided dual chamber cardiac pacemaker. No acute bone abnormality. IMPRESSION: Low lung volumes with hazy densities at the left lung base. Left basilar densities may be related to atelectasis. Subtle infection can not be excluded. Electronically Signed   By: Markus Daft M.D.   On:  12/19/2018 08:43     Medications: Scheduled Meds: . allopurinol  200 mg Oral Daily  . calcitRIOL  0.25 mcg Oral Daily  . carvedilol  3.125 mg Oral BID WC  . digoxin  0.125 mg Oral Daily  . gabapentin  100 mg Oral BID  . hydrALAZINE  25 mg Oral TID  . insulin aspart  0-9 Units Subcutaneous TID WC  . levothyroxine  75 mcg Oral QAC breakfast  . magnesium oxide  400 mg Oral Daily  . sodium bicarbonate  650 mg Oral BID   Continuous Infusions: . albumin human 25 g (12/19/18 0147)   PRN Meds:.acetaminophen **OR** acetaminophen, loperamide, ondansetron **OR** ondansetron (ZOFRAN) IV, oxyCODONE, oxymetazoline, phenylephrine    Assessment/Plan: 73 y.o. female with recently diagnosed cirrhosis and MM. Overnight new left sided nose bleed that is not stopping.  She has cirrhosis, ascites ([facility] [place] 11/2018 showed cirrhosis, ascites, PV thrombosis with cavernous transformation).  Labs 11/2018 [facility]: AFP was <2. Hep B Surface Ag neg, Hep B Core IgM neg, Hepat A IgM neg, Hep C Ab neg.  Will complete workup of cirrhosis with ANA, AMA, ASMA.  I will order those. She will need to be immunized for Hep A/B eventually (can start as outpatient).  She will need EGD to  assess for varices (outpaitent unless clinical situation dictates it must be as IP).    Most important acutely is her nose bleed. Ongoing, impressive amount.  ENT apparently has been called. I expect she will see some melena as a result of the bleeding but that does not mean she is also having a GI bleed.  Next most important is fluid management; she has MM and Cr 2.1. She may be intolerant of diuretics and paracentesis.   Nephrology input appreciated.  Milus Banister, MD  12/19/2018, 9:11 AM [facility] Pager [phone]

## 2018-12-19 NOTE — Progress Notes (Signed)
PROGRESS NOTE    Laura Sampson  WUJ:[id number] DOB: 02/05/1946 DOA: 12/14/2018 PCP: System, Pcp Not In    Brief Narrative:  Per [name] is a 73 y.o. female with medical history significant of DM2, HTN, CAD s/p CABG, PPM.  Recent diagnosis of MM.  Patient recently hospitalized in OSH at end of December for abd pain, swelling, diarrhea.  Work up during hospital stay showed cirrhotic appearance of liver, ascites, though not enough ascites apparently to tap when they tried.  Stool came back positive for C.Diff.  Patient was started on PO vanc, bumex, and aldactone.  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  Also saw a 2.3cm right lobe hepatic mass on [place] 12/21, though the same mass couldn't be visualized on repeat US x5 days later on the 26th.   ED Course: ALK 454, Creat 2.0 AST 210 ALT 27 (about what they had been running at OSH).  CT abd/pelvis shows: Moderate to large volume of ascites with soft tissue anasarca.    Assessment & Plan:   Principal Problem:   Clostridium difficile diarrhea Active Problems:   Epistaxis   Liver mass   Cirrhosis (HCC)   Ascites   DM2 (diabetes mellitus, type 2) (HCC)   HTN (hypertension)   PAF (paroxysmal atrial fibrillation) (HCC)   CKD (chronic kidney disease) stage 4, GFR 15-29 ml/min (HCC)   Multiple myeloma not having achieved remission (HCC)  1 diarrhea/recent diagnosis of C. difficile colitis at outside hospital Patient noted to have recently been diagnosed with a C. difficile colitis at outside hospital.  Patient still with complaints of multiple loose stools.  C. difficile PCR was repeated which was negative.  Pharmacy noted that patient has 12/15/2018 had had a full 14-day course of treatment for C. difficile colitis.  Oral vancomycin has been discontinued.  Bumex and spironolactone on hold.  GI pathogen panel negative.  Imodium as needed. Follow.  #2 epistaxis Patient with epistaxis since  approximately 2 AM last night.  Patient was ordered some Afrin nasal spray as well as Neo-Synephrine nasal spray with gauze packing however patient with ongoing bleeding this morning in the left nare with some bleeding coming from the left eye.  Per son patient with prior history of nasal surgery secondary to sinusitis approximately 2 years ago at [facility] in [place].  Afrin nasal spray as needed.  Tranexamic acid to left nare.  Nasal packing.  ENT consulted who will assess the patient.  Merocell packing to bedside. Discontinue anticoagulation with Eliquis.  3.  Chest pain Patient with complaints of chest tightness.  Patient with crackles noted on examination.  Check a chest x-ray.  EKG.  Cardiac enzymes every 6 hours x3.  2D echo pending.  Lasix 20 mg IV x1.  Follow.  4.  Cirrhosis with ascites/abdominal pain Patient status post ultrasound-guided paracentesis with 3.2 L of hazy fluid removed.  Gram stain and cultures negative to date.  Doubt if patient has SBP.  Patient with some improvement with moderate abdominal discomfort post paracentesis.  Patient feels fluid is building back up.  Urinalysis nitrite negative, leukocytes negative, protein negative.  Bumex and spironolactone on hold due to probable dehydration from multiple episodes of diarrhea and slight bump in creatinine.  Acute hepatitis panel is negative.  Patient status post IV albumin x1 day.  Patient given another dose of IV albumin for 24 hours which started 12/18/2018.  2D echo pending.     Patient with increasing  abdominal distention.  We will hold off on paracentesis for now until patient has been reassessed by GI.  May need another ultrasound-guided paracentesis with no more than 3 to 4 L of fluid removed. Due to patient's complicated medical history of cirrhosis with ascites/anasarca, portal vein thrombosis will gastroenterology has been consulted and are following.   5.  Diabetes mellitus type 2 Patient noted to have a  hypoglycemic episode with a CBG of 34 on 12/16/2018, which improved after orange juice and packets of sugar.  Hemoglobin A1c 8.7.  CBG of 207 this morning.  70/30 has been discontinued.  Continue sliding scale insulin.    6.  Chronic kidney disease stage IV Baseline creatinine approximately 2.  Creatinine currently at 2.22 from 2.10 from 2.27 from 2.56.  Urinalysis negative for proteinuria.  Patient with slight bump in creatinine on 12/17/2018, may be secondary to prerenal azotemia from multiple episodes of diarrhea which is trending back down..  Continue to hold diuretics of Bumex and spironolactone.  Continue calcitriol.  Saline lock IV fluids.  Patient now with complaints of some chest tightness with some crackles noted on examination.  Get a chest x-ray.  Lasix 20 mg IV x1.   Will consult with nephrology for further evaluation and management as patient with cirrhosis and ascites as well as history of recently diagnosed multiple myeloma.   7.??  Liver mass Initial concern for 2.3 cm right lobe mass concerning for neoplasm seen on right upper quadrant ultrasound 11/26/2018.  Repeat ultrasound 5 days later could not see a mass.  Patient unable to have a MRI due to PPM.  CT abdomen and pelvis done during this hospitalization without contrast with no mention of a liver mass.  Acute hepatitis panel negative.  GI has been consulted and are following.   8.  Multiple myeloma Outpatient follow-up with oncology.  9.  Paroxysmal atrial fibrillation Continue Coreg and digoxin.  Eliquis was held and patient placed on a heparin drip in anticipation of ultrasound-guided paracentesis.  Patient status post paracentesis.  Heparin drip discontinued as patient noted to have a bout of epistaxis the night of 12/16/2018.  Resumed Eliquis at 2.5 mg twice daily as dosed per pharmacy due to patient's renal function and history of cirrhosis.  Patient now with epistaxis the early hours of 12/19/2018.  Discontinue Eliquis and will  not resume at this time.  10.  Hypertension Blood pressure stable.  Continue Coreg.  Bumex and spironolactone on hold.  11.  Hyperkalemia Kayexalate x1.  Repeat potassium with resolution of hyperkalemia.  Follow.   DVT prophylaxis: SCDs.  Patient with epistaxis. Code Status: Full Family Communication: Updated patient and sons at bedside.  Disposition Plan: Home when clinically improved with improvement in renal function.   Consultants:   GI: Dr. Carlean Purl 12/18/2018  ENT pending  Procedures:   Ultrasound-guided paracentesis--3.2 L of hazy amber fluid removed per interventional radiology 12/15/2018  CT renal stone protocol 12/14/2018    Antimicrobials:   None   Subjective: Patient with nasal bleeding from the left nare ongoing since approximately 2 AM this morning.  Patient has received Afrin and Neo-Synephrine however with ongoing bleeding.  Some bleeding noted from the left eye.  Patient complaining of chest tightness.  Denies any significant shortness of breath.  Patient with some abdominal pain.   Objective: Vitals:   12/18/18 2114 12/19/18 0348 12/19/18 0610 12/19/18 0646  BP: (!) 135/58 (!) 153/65 (!) 152/68 (!) 154/57  Pulse: 81 81 85   Resp:  18 15 15    Temp: 99 F (37.2 C) 99.1 F (37.3 C) 99.2 F (37.3 C)   TempSrc: Oral Oral Oral   SpO2: 98% 98% 97%   Weight:      Height:        Intake/Output Summary (Last 24 hours) at 12/19/2018 0753 Last data filed at 12/18/2018 2200 Gross per 24 hour  Intake 776.94 ml  Output -  Net 776.94 ml   Filed Weights   12/15/18 0321  Weight: 68 kg    Examination:  General exam: NAD. Respiratory system: Some crackles noted on examination.  No rhonchi.  No wheezing.  Respiratory effort normal. HEENT: Patient with profuse nasal bleeding from the left nare.  Bleeding noted from the left eye. Cardiovascular system: RRR no murmurs rubs or gallops.  No JVD.  No lower extremity edema.  Gastrointestinal system: Abdomen is  distended, tender to palpation in the epigastrium right upper quadrant.  Positive bowel sounds.  Somewhat tight.  No rebound.  No guarding.   Central nervous system: Alert and oriented. No focal neurological deficits. Extremities: Symmetric 5 x 5 power. Skin: No rashes, lesions or ulcers Psychiatry: Judgement and insight appear normal. Mood & affect appropriate.     Data Reviewed: I have personally reviewed following labs and imaging studies  CBC: Recent Labs  Lab 12/14/18 2313  12/16/18 0147 12/16/18 2307 12/17/18 0459 12/18/18 0415 12/19/18 0423  WBC 4.5  --  3.4* 3.2* 2.7* 2.8* 2.8*  NEUTROABS 3.3  --   --   --   --   --   --   HGB 10.1*   < > 9.5* 8.8* 8.0* 8.0* 7.6*  HCT 31.9*   < > 30.2* 28.1* 25.4* 25.5* 24.2*  MCV 96.7  --  95.6 94.6 96.2 98.1 98.8  PLT 134*  --  125* 120* 90* 96* 95*   < > = values in this interval not displayed.   Basic Metabolic Panel: Recent Labs  Lab 12/15/18 0212 12/16/18 0147 12/17/18 0459 12/18/18 0415 12/18/18 1344 12/19/18 0423  NA 142 139 138 138  --  141  K 4.8 4.3 4.5 5.4* 4.7 4.8  CL 117* 110 109 111  --  112*  CO2  --  22 22 21*  --  20*  GLUCOSE 132* 106* 179* 152*  --  199*  BUN 50* 43* 36* 33*  --  33*  CREATININE 2.00* 2.56* 2.27* 2.10*  --  2.22*  CALCIUM  --  8.5* 8.4* 8.5*  --  9.0  MG  --  2.1  --   --   --   --   PHOS  --   --   --  2.8  --   --    GFR: Estimated Creatinine Clearance: 20.7 mL/min (A) (by C-G formula based on SCr of 2.22 mg/dL (H)). Liver Function Tests: Recent Labs  Lab 12/14/18 2313 12/16/18 0147 12/17/18 0459 12/18/18 0415 12/19/18 0423  AST 210* 176* 160*  --  173*  ALT 27 21 18   --  14  ALKPHOS 454* 359* 290*  --  286*  BILITOT 0.7 0.6 0.6  --  0.9  PROT 8.2* 6.9 6.9  --  7.8  ALBUMIN 3.0* 2.4* 3.6 3.9 4.6   Recent Labs  Lab 12/14/18 2313  LIPASE 27   No results for input(s): AMMONIA in the last 168 hours. Coagulation Profile: No results for input(s): INR, PROTIME in the last  168 hours. Cardiac Enzymes: No results for  input(s): CKTOTAL, CKMB, CKMBINDEX, TROPONINI in the last 168 hours. BNP (last 3 results) No results for input(s): PROBNP in the last 8760 hours. HbA1C: No results for input(s): HGBA1C in the last 72 hours. CBG: Recent Labs  Lab 12/18/18 0732 12/18/18 1248 12/18/18 1713 12/18/18 2118 12/19/18 0738  GLUCAP 147* 145* 198* 150* 207*   Lipid Profile: No results for input(s): CHOL, HDL, LDLCALC, TRIG, CHOLHDL, LDLDIRECT in the last 72 hours. Thyroid Function Tests: No results for input(s): TSH, T4TOTAL, FREET4, T3FREE, THYROIDAB in the last 72 hours. Anemia Panel: No results for input(s): VITAMINB12, FOLATE, FERRITIN, TIBC, IRON, RETICCTPCT in the last 72 hours. Sepsis Labs: No results for input(s): PROCALCITON, LATICACIDVEN in the last 168 hours.  Recent Results (from the past 240 hour(s))  Gastrointestinal Panel by PCR , Stool     Status: None   Collection Time: 12/15/18 11:11 AM  Result Value Ref Range Status   Campylobacter species NOT DETECTED NOT DETECTED Final   Plesimonas shigelloides NOT DETECTED NOT DETECTED Final   Salmonella species NOT DETECTED NOT DETECTED Final   Yersinia enterocolitica NOT DETECTED NOT DETECTED Final   Vibrio species NOT DETECTED NOT DETECTED Final   Vibrio cholerae NOT DETECTED NOT DETECTED Final   Enteroaggregative E coli (EAEC) NOT DETECTED NOT DETECTED Final   Enteropathogenic E coli (EPEC) NOT DETECTED NOT DETECTED Final   Enterotoxigenic E coli (ETEC) NOT DETECTED NOT DETECTED Final   Shiga like toxin producing E coli (STEC) NOT DETECTED NOT DETECTED Final   Shigella/Enteroinvasive E coli (EIEC) NOT DETECTED NOT DETECTED Final   Cryptosporidium NOT DETECTED NOT DETECTED Final   Cyclospora cayetanensis NOT DETECTED NOT DETECTED Final   Entamoeba histolytica NOT DETECTED NOT DETECTED Final   Giardia lamblia NOT DETECTED NOT DETECTED Final   Adenovirus F40/41 NOT DETECTED NOT DETECTED Final    Astrovirus NOT DETECTED NOT DETECTED Final   Norovirus GI/GII NOT DETECTED NOT DETECTED Final   Rotavirus A NOT DETECTED NOT DETECTED Final   Sapovirus (I, II, IV, and V) NOT DETECTED NOT DETECTED Final    Comment: Performed at [facility], [address] [place], [place] [address]  C difficile quick scan w PCR reflex     Status: None   Collection Time: 12/15/18 11:11 AM  Result Value Ref Range Status   C Diff antigen NEGATIVE NEGATIVE Final   C Diff toxin NEGATIVE NEGATIVE Final   C Diff interpretation No C. difficile detected.  Final    Comment: Performed at [facility], [place] [address], [place], [place] [address]  Culture, body fluid-bottle     Status: None (Preliminary result)   Collection Time: 12/15/18  3:58 PM  Result Value Ref Range Status   Specimen Description FLUID PERITONEAL  Final   Special Requests BOTTLES DRAWN AEROBIC AND ANAEROBIC  Final   Culture   Final    NO GROWTH 4 DAYS Performed at [facility], [place] [address] [place], [place] [address]    Report Status PENDING  Incomplete  Gram stain     Status: None   Collection Time: 12/15/18  3:58 PM  Result Value Ref Range Status   Specimen Description FLUID PERITONEAL  Final   Special Requests NONE  Final   Gram Stain   Final    CYTOSPIN SMEAR NO WBC SEEN NO ORGANISMS SEEN Performed at [facility], [address] [place], [place] [address]    Report Status 12/16/2018 FINAL  Final  Culture, Urine  Status: Abnormal   Collection Time: 12/16/18  6:38 AM  Result Value Ref Range Status   Specimen Description URINE, CLEAN CATCH  Final   Special Requests   Final    NONE Performed at [facility] [address] [place], [place] [address]    Culture (A)  Final    80,000 COLONIES/mL MULTIPLE SPECIES PRESENT, SUGGEST RECOLLECTION   Report Status 12/17/2018 FINAL  Final         Radiology Studies: No results found.      Scheduled Meds: .  allopurinol  200 mg Oral Daily  . calcitRIOL  0.25 mcg Oral Daily  . carvedilol  3.125 mg Oral BID WC  . digoxin  0.125 mg Oral Daily  . gabapentin  100 mg Oral BID  . hydrALAZINE  25 mg Oral TID  . insulin aspart  0-9 Units Subcutaneous TID WC  . levothyroxine  75 mcg Oral QAC breakfast  . magnesium oxide  400 mg Oral Daily  . sodium bicarbonate  650 mg Oral BID   Continuous Infusions: . albumin human 25 g (12/19/18 0147)     LOS: 3 days    Time spent: 40 minutes    Irine Seal, MD [facility]  If 7PM-7AM, please contact night-coverage [email or web address] 12/19/2018, 7:53 AM

## 2018-12-19 NOTE — Progress Notes (Signed)
  Echocardiogram 2D Echocardiogram has been performed.  Laura Sampson 12/19/2018, 3:11 PM

## 2018-12-19 NOTE — Consult Note (Addendum)
Reason for Consult: Left epistaxis  HPI:  Laura Sampson is an 73 y.o. female who began experiencing left sided epistaxis earlier this morning. The patient is currently on chronic anticoagulation secondary to paroxysmal atrial fibrillation. She has multiple medical problems, including DM2, HTN, CAD s/p CABG, afib, and potal vein thrombosis. She is currently admitted for treatment of her C. Difficile colitis.  Pt was seen by ED physician Dr. Venora Maples. A left nasal packing was placed with good hemostasis. Pt denies previous epistaxis.  Past Medical History:  Diagnosis Date  . CAD (coronary artery disease)   . DM2 (diabetes mellitus, type 2) ([place])   . HTN (hypertension)   . Multiple myeloma not having achieved remission ([place])   . PAF (paroxysmal atrial fibrillation) ([place])   . Portal vein thrombosis   . Renal disorder    "low functioning" no treatment needed at this time    Past Surgical History:  Procedure Laterality Date  . COLONOSCOPY    . CORONARY ARTERY BYPASS GRAFT    . ESOPHAGOGASTRODUODENOSCOPY    . NASAL SINUS SURGERY    . PACEMAKER IMPLANT  2012   [employer]  . PARTIAL HYSTERECTOMY      Family History  Problem Relation Age of Onset  . Diabetes Mother   . Breast cancer Mother   . Diabetes Father   . Heart disease Father   . Diabetes Sister     Social History:  reports that she quit smoking about 37 years ago. She quit after 4.00 years of use. She has never used smokeless tobacco. She reports that she does not drink alcohol or use drugs.  Allergies:  Allergies  Allergen Reactions  . Penicillins Rash    Has patient had a PCN reaction causing immediate rash, facial/tongue/throat swelling, SOB or lightheadedness with hypotension: Yes Has patient had a PCN reaction causing severe rash involving mucus membranes or skin necrosis: Yes Has patient had a PCN reaction that required hospitalization: No Has patient had a PCN reaction occurring within the last 10 years: Unknown If  all of the above answers are "NO", then may proceed with Cephalosporin use.   . Clindamycin/Lincomycin Itching    Prior to Admission medications   Medication Sig Start Date End Date Taking? Authorizing Provider  acetaminophen (TYLENOL) 500 MG tablet Take 1,000 mg by mouth every 6 (six) hours as needed for mild pain, moderate pain, fever or headache.   Yes [provider]  allopurinol (ZYLOPRIM) 100 MG tablet Take 200 mg by mouth daily.   Yes [provider]  apixaban (ELIQUIS) 5 MG TABS tablet Take 5 mg by mouth 2 (two) times daily.   Yes [provider]  bumetanide (BUMEX) 0.5 MG tablet Take 0.5 mg by mouth daily.   Yes [provider]  calcitRIOL (ROCALTROL) 0.25 MCG capsule Take 0.25 mcg by mouth daily.   Yes [provider]  carvedilol (COREG) 3.125 MG tablet Take 3.125 mg by mouth 2 (two) times daily with a meal.   Yes [provider]  digoxin (LANOXIN) 0.125 MG tablet Take 0.125 mg by mouth daily.    Yes [provider]  gabapentin (NEURONTIN) 100 MG capsule Take 100 mg by mouth 2 (two) times daily.   Yes [provider]  hydrALAZINE (APRESOLINE) 25 MG tablet Take 25 mg by mouth 3 (three) times daily.    Yes [provider]  insulin lispro (HUMALOG) 100 UNIT/ML injection Inject 0.16 mLs (16 Units total) into the skin 3 (three)  times daily before meals. Patient taking differently: Inject 8 Units into the skin 3 (three) times daily before meals.  08/23/17  Yes Maczis, Barth Kirks, PA-C  insulin NPH Human (HUMULIN N,NOVOLIN N) 100 UNIT/ML injection Inject 45 Units into the skin 2 (two) times daily. 12/04/18 02/09/19 Yes [provider]  levothyroxine (SYNTHROID, LEVOTHROID) 75 MCG tablet Take 75 mcg by mouth daily before breakfast.   Yes [provider]  magnesium oxide (MAG-OX) 400 MG tablet Take 400 mg by mouth daily.   Yes [provider]  sodium bicarbonate 650 MG tablet Take 650 mg by  mouth 2 (two) times daily.   Yes [provider]  spironolactone (ALDACTONE) 25 MG tablet Take 25 mg by mouth daily.   Yes [provider]    Medications:  I have reviewed the patient's current medications. Scheduled: . sodium chloride   Intravenous Once  . acetaminophen  650 mg Oral Once  . allopurinol  200 mg Oral Daily  . calcitRIOL  0.25 mcg Oral Daily  . carvedilol  3.125 mg Oral BID WC  . digoxin  0.125 mg Oral Daily  . diphenhydrAMINE  25 mg Oral Once  . furosemide  20 mg Intravenous Once  . furosemide  20 mg Intravenous Once  . [START ON 12/20/2018] furosemide  40 mg Oral Daily  . gabapentin  100 mg Oral BID  . hydrALAZINE  25 mg Oral TID  . insulin aspart  0-9 Units Subcutaneous TID WC  . levothyroxine  75 mcg Oral QAC breakfast  . magnesium oxide  400 mg Oral Daily  . sodium bicarbonate  650 mg Oral BID   Continuous: . ferumoxytol      Dg Chest Port 1 View  Result Date: 12/19/2018 CLINICAL DATA:  Chest tightness. EXAM: PORTABLE CHEST 1 VIEW COMPARISON:  11/30/2017 FINDINGS: Decreased lung volumes compared to the previous examination. Hazy densities in the left lower chest. Upper lungs are clear. Heart size remains enlarged with evidence of previous cardiac surgery. Stable appearance of the right-sided dual chamber cardiac pacemaker. No acute bone abnormality. IMPRESSION: Low lung volumes with hazy densities at the left lung base. Left basilar densities may be related to atelectasis. Subtle infection can not be excluded. Electronically Signed   By: Markus Daft M.D.   On: 12/19/2018 08:43   ROS: All systems reviewed negative except as per HPI  Blood pressure 138/66, pulse 76, temperature 98.6 F (37 C), temperature source Oral, resp. rate 16, height 5' 2"  (1.575 m), weight 68 kg, SpO2 96 %. Physical Exam: Gen: Well-appearing. No acute distress  Eyes: Her pupils are equal, round, reactive to light. Extraocular motion is intact.  Ears: Examination of the  ears shows normal auricles and external auditory canals bilaterally.  Nose: Nasal examination shows a rhinorocket packing in the left nasal cavity. No acute bleeding at this time. Face: Facial examination shows no asymmetry. Palpation of the face elicit no significant tenderness.  Mouth: Oral cavity examination shows no mucosal lacerations. No significant trismus is noted. No blood in pharynx. Neck: Palpation of the neck reveals no lymphadenopathy or mass. The trachea is midline.  Neuro: Cranial nerves 2-12 are grossly intact.  Assessment/Plan: Left epistaxis, now control with left nasal packing. - Hold eliquis for now. - Consider gram+ abx coverage while the packing is in place, if not contraindicated by her C Diff colitis. - Will leave the packing in place for approximately 5 days. - Will follow.  Harpreet Signore W Tamiki Kuba 12/19/2018, 4:07 PM

## 2018-12-19 NOTE — Progress Notes (Addendum)
Pt having a nose bleed. Attempted nasal packing/ pressure/ice to nose with no relief. Pt is having bloody drainage into her mouth/throat and is having to spit it out.  NP on call notified.

## 2018-12-19 NOTE — Progress Notes (Signed)
NP on call paged to update about pts hgb and to report pts nose continuing to bleed .

## 2018-12-19 NOTE — Consult Note (Signed)
Department of Emergency Medicine CONSULTATION    CONSULT NOTE  Chief Complaint: Epistaxis   History of present illness: I was contacted by the hospitalist Dr Grandville Silos for peristant epistaxis and inability of ENT to come to bedside at this time. Request for nasal packing  Persistent left-sided epistaxis despite cocaine and Afrin.  Patient is currently on chronic anticoagulation secondary to paroxysmal atrial fibrillation.  Patient is currently hospitalized for abdominal pain.  Has been swallowing some blood today and coughing up blood secondary to her epistaxis.  ROS: All systems reviewed negative except as per HPI  Scheduled Meds: . allopurinol  200 mg Oral Daily  . calcitRIOL  0.25 mcg Oral Daily  . carvedilol  3.125 mg Oral BID WC  . digoxin  0.125 mg Oral Daily  . gabapentin  100 mg Oral BID  . hydrALAZINE  25 mg Oral TID  . insulin aspart  0-9 Units Subcutaneous TID WC  . levothyroxine  75 mcg Oral QAC breakfast  . magnesium oxide  400 mg Oral Daily  . sodium bicarbonate  650 mg Oral BID   Continuous Infusions: . albumin human 25 g (12/19/18 0147)   PRN Meds:.acetaminophen **OR** acetaminophen, loperamide, ondansetron **OR** ondansetron (ZOFRAN) IV, oxyCODONE, oxymetazoline, phenylephrine Past Medical History:  Diagnosis Date  . CAD (coronary artery disease)   . DM2 (diabetes mellitus, type 2) ([place])   . HTN (hypertension)   . Multiple myeloma not having achieved remission ([place])   . PAF (paroxysmal atrial fibrillation) ([place])   . Portal vein thrombosis   . Renal disorder    "low functioning" no treatment needed at this time   Past Surgical History:  Procedure Laterality Date  . COLONOSCOPY    . CORONARY ARTERY BYPASS GRAFT    . ESOPHAGOGASTRODUODENOSCOPY    . NASAL SINUS SURGERY    . PACEMAKER IMPLANT  2012   [employer]  . PARTIAL HYSTERECTOMY     Social History   Socioeconomic History  . Marital status: Married    Spouse name: Not on file  . Number of  children: Not on file  . Years of education: Not on file  . Highest education level: Not on file  Occupational History  . Not on file  Social Needs  . Financial resource strain: Not on file  . Food insecurity:    Worry: Not on file    Inability: Not on file  . Transportation needs:    Medical: Not on file    Non-medical: Not on file  Tobacco Use  . Smoking status: Former Smoker    Years: 4.00    Last attempt to quit: 1983    Years since quitting: 37.0  . Smokeless tobacco: Never Used  Substance and Sexual Activity  . Alcohol use: No  . Drug use: No  . Sexual activity: Not on file  Lifestyle  . Physical activity:    Days per week: Not on file    Minutes per session: Not on file  . Stress: Not on file  Relationships  . Social connections:    Talks on phone: Not on file    Gets together: Not on file    Attends religious service: Not on file    Active member of club or organization: Not on file    Attends meetings of clubs or organizations: Not on file    Relationship status: Not on file  . Intimate partner violence:    Fear of current or ex partner: Not on file  Emotionally abused: Not on file    Physically abused: Not on file    Forced sexual activity: Not on file  Other Topics Concern  . Not on file  Social History Narrative   Married, retired from [employer] to [place] - moved back to [place] 11/2018 due to health problems   Former smoker, no EtOH   + children, grandchildren   Allergies  Allergen Reactions  . Penicillins Rash    Has patient had a PCN reaction causing immediate rash, facial/tongue/throat swelling, SOB or lightheadedness with hypotension: Yes Has patient had a PCN reaction causing severe rash involving mucus membranes or skin necrosis: Yes Has patient had a PCN reaction that required hospitalization: No Has patient had a PCN reaction occurring within the last 10 years: Unknown If all of the above answers are "NO", then may proceed with  Cephalosporin use.   . Clindamycin/Lincomycin Itching    Last set of Vital Signs (not current) Vitals:   12/19/18 0646 12/19/18 0954  BP: (!) 154/57 (!) 143/67  Pulse:    Resp:    Temp:    SpO2:        Physical Exam Gen: Well-appearing.  No acute distress  Cardiovascular: Heart rate normal  resp: No respiratory distress.  Work of breathing is normal Abd: nondistended  Neuro: GCS 15.  Moves all extremities HEENT: Dried blood in posterior pharynx.  Nasal gauze packing in left nare with out over surrounding bleeding.  Nurse holding pressure at this time. Neck: No crepitus  Musculoskeletal: No deformity  Skin: warm   .Epistaxis Management Date/Time: 12/19/2018 11:05 AM Performed by: Jola Schmidt, MD Authorized by: Jola Schmidt, MD   Consent:    Consent obtained:  Verbal   Consent given by:  Patient and healthcare agent   Risks discussed:  Bleeding, nasal injury and pain   Alternatives discussed:  No treatment Anesthesia (see MAR for exact dosages):    Anesthesia method:  Topical application   Topical anesthesia: afrin. Procedure details:    Treatment site:  L anterior   Treatment method:  Nasal tampon   Treatment complexity:  Limited   Treatment episode: initial   Post-procedure details:    Assessment:  Bleeding stopped   Patient tolerance of procedure:  Tolerated well, no immediate complications      Medical Decision making  Acute left-sided epistaxis.  Patient tolerated the procedure well.  No recurrent bleeding.  Assessment and Plan   Acute left-sided epistaxis  Recommendation 1.  Recommend ice pack to left side of face 2.  Nasal tampon to be continued until seen and evaluated by ENT 3.  Formal ENT consultation 4.  Call with questions or concerns or repeat bleeding prior to ENT consultation (Direct line: [phone])  Jola Schmidt

## 2018-12-19 NOTE — Consult Note (Addendum)
[facility] Consultation Note  Requesting MD: Dr. Grandville Silos, Quillian Quince Reason for consult: CKD  Assessment/Plan: #Chronic kidney disease stage IV: Likely multifactorial etiology including uncontrolled diabetes and liver cirrhosis/ascites. May have some effect due to MM however UA is unremarkable. I will check UA with microscopy.  Urine sodium level 57 less likely hepatorenal syndrome.  The recent worsening in creatinine level likely contributed by liver cirrhosis, ascites requiring diuresis and paracentesis.  Patient is not on NSAIDs.  Ultrasound kidneys with no obstruction however consistent with CKD. -Check bladder scan -Monitor BMP urine output.  Avoid nephrotoxins -We will start Lasix 40 mg daily -Patient needs close outpatient follow-up.  Per patient she was already referred to [facility].  #Hypertension: Blood pressure acceptable.  She is currently on Coreg, hydralazine and diuretics.  #Epistaxis: Nasal packing was done by ER.  She is on anticoagulation.  ENT consult pending.  The bleeding has stopped now.  #Liver cirrhosis, ascites: GI is following.  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  Iron saturation 14, ferritin is mildly elevated.  Given bleeding, I will order a dose of IV iron.  Monitor CBC.  Thank you for the consult.  We will continue to follow with you.   HPI:  Laura Sampson is a 73 y.o. female with history of hypertension, diabetes with last HbA1c of 8.7, CAD, CABG, NASH, liver cirrhosis complicated by ascites, portal vein thrombosis on anticoagulation, recent diagnosis of multiple myeloma and received 2 rounds of chemotherapy with adverse effect including rash, consulted for evaluation and management of CKD.    Patient was recently hospitalized at the end of December for abdominal pain swelling and diarrhea.  She was treated for C. difficile positive with oral vancomycin.  For multiple myeloma patient was following at [facility]  with [place].  She received 2 doses of Velcade and Revlimid however treatment was held because of adverse effect. GI is following for liver cirrhosis.  Patient has CKD with baseline serum creatinine level 1.5-2.  The creatinine elevated up to 2.5 in December 2019.  The urinalysis was unremarkable.  The ultrasound of kidneys showed increased renal echogenicity, no hydronephrosis.  Patient received a dose of IV Lasix today and continue on sodium bicarbonate.  She currently has external urinary catheter, the urine output is not exactly monitor.  She was on Bumex 0.5 and Aldactone which was discontinued on admission.  Patient denies headache, dizziness, nausea, vomiting, chest pain or shortness of breath.  No abdominal pain.  Her husband and family at bedside.  She has generalized weakness. Nasal packing was done today for epistaxis.  Creatinine, Ser  Date/Time Value Ref Range Status  12/19/2018 04:23 AM 2.22 (H) 0.44 - 1.00 mg/dL Final  12/18/2018 04:15 AM 2.10 (H) 0.44 - 1.00 mg/dL Final  12/17/2018 04:59 AM 2.27 (H) 0.44 - 1.00 mg/dL Final  12/16/2018 01:47 AM 2.56 (H) 0.44 - 1.00 mg/dL Final  12/15/2018 02:12 AM 2.00 (H) 0.44 - 1.00 mg/dL Final  11/30/2017 10:20 AM 1.55 (H) 0.44 - 1.00 mg/dL Final  11/30/2017 05:15 AM 1.77 (H) 0.44 - 1.00 mg/dL Final     PMHx:   Past Medical History:  Diagnosis Date  . CAD (coronary artery disease)   . DM2 (diabetes mellitus, type 2) ([place])   . HTN (hypertension)   . Multiple myeloma not having achieved remission ([place])   . PAF (paroxysmal atrial fibrillation) ([place])   . Portal vein thrombosis   . Renal disorder    "low  functioning" no treatment needed at this time    Past Surgical History:  Procedure Laterality Date  . COLONOSCOPY    . CORONARY ARTERY BYPASS GRAFT    . ESOPHAGOGASTRODUODENOSCOPY    . NASAL SINUS SURGERY    . PACEMAKER IMPLANT  2012   [employer]  . PARTIAL HYSTERECTOMY      Family Hx:  Family History  Problem Relation  Age of Onset  . Diabetes Mother   . Breast cancer Mother   . Diabetes Father   . Heart disease Father   . Diabetes Sister     Social History:  reports that she quit smoking about 37 years ago. She quit after 4.00 years of use. She has never used smokeless tobacco. She reports that she does not drink alcohol or use drugs.  Allergies:  Allergies  Allergen Reactions  . Penicillins Rash    Has patient had a PCN reaction causing immediate rash, facial/tongue/throat swelling, SOB or lightheadedness with hypotension: Yes Has patient had a PCN reaction causing severe rash involving mucus membranes or skin necrosis: Yes Has patient had a PCN reaction that required hospitalization: No Has patient had a PCN reaction occurring within the last 10 years: Unknown If all of the above answers are "NO", then may proceed with Cephalosporin use.   . Clindamycin/Lincomycin Itching    Medications: Prior to Admission medications   Medication Sig Start Date End Date Taking? Authorizing Provider  acetaminophen (TYLENOL) 500 MG tablet Take 1,000 mg by mouth every 6 (six) hours as needed for mild pain, moderate pain, fever or headache.   Yes [provider]  allopurinol (ZYLOPRIM) 100 MG tablet Take 200 mg by mouth daily.   Yes [provider]  apixaban (ELIQUIS) 5 MG TABS tablet Take 5 mg by mouth 2 (two) times daily.   Yes [provider]  bumetanide (BUMEX) 0.5 MG tablet Take 0.5 mg by mouth daily.   Yes [provider]  calcitRIOL (ROCALTROL) 0.25 MCG capsule Take 0.25 mcg by mouth daily.   Yes [provider]  carvedilol (COREG) 3.125 MG tablet Take 3.125 mg by mouth 2 (two) times daily with a meal.   Yes [provider]  digoxin (LANOXIN) 0.125 MG tablet Take 0.125 mg by mouth daily.    Yes [provider]  gabapentin (NEURONTIN) 100 MG capsule Take 100 mg by mouth 2 (two) times daily.   Yes [provider]  hydrALAZINE (APRESOLINE)  25 MG tablet Take 25 mg by mouth 3 (three) times daily.    Yes [provider]  insulin lispro (HUMALOG) 100 UNIT/ML injection Inject 0.16 mLs (16 Units total) into the skin 3 (three) times daily before meals. Patient taking differently: Inject 8 Units into the skin 3 (three) times daily before meals.  08/23/17  Yes Maczis, Barth Kirks, PA-C  insulin NPH Human (HUMULIN N,NOVOLIN N) 100 UNIT/ML injection Inject 45 Units into the skin 2 (two) times daily. 12/04/18 02/09/19 Yes [provider]  levothyroxine (SYNTHROID, LEVOTHROID) 75 MCG tablet Take 75 mcg by mouth daily before breakfast.   Yes [provider]  magnesium oxide (MAG-OX) 400 MG tablet Take 400 mg by mouth daily.   Yes [provider]  sodium bicarbonate 650 MG tablet Take 650 mg by mouth 2 (two) times daily.   Yes [provider]  spironolactone (ALDACTONE) 25 MG tablet Take 25 mg by mouth daily.   Yes [provider]    I have reviewed the patient's current  medications.  Labs:  Results for orders placed or performed during the hospital encounter of 12/14/18 (from the past 48 hour(s))  Glucose, capillary     Status: Abnormal   Collection Time: 12/17/18  4:20 PM  Result Value Ref Range   Glucose-Capillary 195 (H) 70 - 99 mg/dL  Glucose, capillary     Status: Abnormal   Collection Time: 12/17/18  9:35 PM  Result Value Ref Range   Glucose-Capillary 187 (H) 70 - 99 mg/dL  CBC     Status: Abnormal   Collection Time: 12/18/18  4:15 AM  Result Value Ref Range   WBC 2.8 (L) 4.0 - 10.5 K/uL   RBC 2.60 (L) 3.87 - 5.11 MIL/uL   Hemoglobin 8.0 (L) 12.0 - 15.0 g/dL   HCT 25.5 (L) 36.0 - 46.0 %   MCV 98.1 80.0 - 100.0 fL   MCH 30.8 26.0 - 34.0 pg   MCHC 31.4 30.0 - 36.0 g/dL   RDW 18.7 (H) 11.5 - 15.5 %   Platelets 96 (L) 150 - 400 K/uL    Comment: Immature Platelet Fraction may be clinically indicated, consider ordering this additional test [id number]    nRBC 0.0 0.0 - 0.2 %     Comment: Performed at [facility], [place] [address] [place], [place] [address]  Renal function panel     Status: Abnormal   Collection Time: 12/18/18  4:15 AM  Result Value Ref Range   Sodium 138 135 - 145 mmol/L   Potassium 5.4 (H) 3.5 - 5.1 mmol/L   Chloride 111 98 - 111 mmol/L   CO2 21 (L) 22 - 32 mmol/L   Glucose, Bld 152 (H) 70 - 99 mg/dL   BUN 33 (H) 8 - 23 mg/dL   Creatinine, Ser 2.10 (H) 0.44 - 1.00 mg/dL   Calcium 8.5 (L) 8.9 - 10.3 mg/dL   Phosphorus 2.8 2.5 - 4.6 mg/dL   Albumin 3.9 3.5 - 5.0 g/dL   GFR calc non Af Amer 23 (L) >60 mL/min   GFR calc Af Amer 27 (L) >60 mL/min   Anion gap 6 5 - 15    Comment: Performed at [facility], [place] [address] [place], [place] [address]  Glucose, capillary     Status: Abnormal   Collection Time: 12/18/18  7:32 AM  Result Value Ref Range   Glucose-Capillary 147 (H) 70 - 99 mg/dL  Glucose, capillary     Status: Abnormal   Collection Time: 12/18/18 12:48 PM  Result Value Ref Range   Glucose-Capillary 145 (H) 70 - 99 mg/dL  Potassium     Status: None   Collection Time: 12/18/18  1:44 PM  Result Value Ref Range   Potassium 4.7 3.5 - 5.1 mmol/L    Comment: Performed at [facility], [place] [address], [place], [place] [address]  Glucose, capillary     Status: Abnormal   Collection Time: 12/18/18  5:13 PM  Result Value Ref Range   Glucose-Capillary 198 (H) 70 - 99 mg/dL  Glucose, capillary     Status: Abnormal   Collection Time: 12/18/18  9:18 PM  Result Value Ref Range   Glucose-Capillary 150 (H) 70 - 99 mg/dL  Comprehensive metabolic panel     Status: Abnormal   Collection Time: 12/19/18  4:23 AM  Result Value Ref Range   Sodium 141 135 - 145 mmol/L   Potassium 4.8 3.5 - 5.1 mmol/L   Chloride 112 (H) 98 - 111 mmol/L  CO2 20 (L) 22 - 32 mmol/L   Glucose, Bld 199 (H) 70 - 99 mg/dL   BUN 33 (H) 8 - 23 mg/dL   Creatinine, Ser 2.22 (H) 0.44 - 1.00 mg/dL   Calcium 9.0 8.9 -  10.3 mg/dL   Total Protein 7.8 6.5 - 8.1 g/dL   Albumin 4.6 3.5 - 5.0 g/dL   AST 173 (H) 15 - 41 U/L   ALT 14 0 - 44 U/L   Alkaline Phosphatase 286 (H) 38 - 126 U/L   Total Bilirubin 0.9 0.3 - 1.2 mg/dL   GFR calc non Af Amer 21 (L) >60 mL/min   GFR calc Af Amer 25 (L) >60 mL/min   Anion gap 9 5 - 15    Comment: Performed at [facility], [place] [address], [place], [place] [address]  CBC     Status: Abnormal   Collection Time: 12/19/18  4:23 AM  Result Value Ref Range   WBC 2.8 (L) 4.0 - 10.5 K/uL   RBC 2.45 (L) 3.87 - 5.11 MIL/uL   Hemoglobin 7.6 (L) 12.0 - 15.0 g/dL   HCT 24.2 (L) 36.0 - 46.0 %   MCV 98.8 80.0 - 100.0 fL   MCH 31.0 26.0 - 34.0 pg   MCHC 31.4 30.0 - 36.0 g/dL   RDW 18.9 (H) 11.5 - 15.5 %   Platelets 95 (L) 150 - 400 K/uL    Comment: REPEATED TO VERIFY Immature Platelet Fraction may be clinically indicated, consider ordering this additional test [id number] CONSISTENT WITH PREVIOUS RESULT    nRBC 0.0 0.0 - 0.2 %    Comment: Performed at [facility], [place] [address] [place], [place] [address]  Glucose, capillary     Status: Abnormal   Collection Time: 12/19/18  7:38 AM  Result Value Ref Range   Glucose-Capillary 207 (H) 70 - 99 mg/dL  ABO/Rh     Status: None   Collection Time: 12/19/18  8:05 AM  Result Value Ref Range   ABO/RH(D)      A POS Performed at [facility], [place] [address] [place], [place] [address]   Protime-INR     Status: Abnormal   Collection Time: 12/19/18  8:15 AM  Result Value Ref Range   Prothrombin Time 17.1 (H) 11.4 - 15.2 seconds   INR 1.41     Comment: Performed at [facility], [place] [address] [place], [place] [address]  Troponin I - Now Then Q6H     Status: Abnormal   Collection Time: 12/19/18  8:15 AM  Result Value Ref Range   Troponin I 0.04 (HH) <0.03 ng/mL    Comment: CRITICAL RESULT CALLED TO, READ BACK BY AND VERIFIED WITHExcell Seltzer RN AT [phone]  12/19/18 MULLINS,T Performed at [facility], [place] [address] [place], [place] [address]   Type and screen [place]     Status: None   Collection Time: 12/19/18  8:15 AM  Result Value Ref Range   ABO/RH(D) A POS    Antibody Screen NEG    Sample Expiration      12/22/2018 Performed at [facility], [place] [address], [place], [place] [address]   Glucose, capillary     Status: Abnormal   Collection Time: 12/19/18 11:44 AM  Result Value Ref Range   Glucose-Capillary 186 (H) 70 - 99 mg/dL  Hemoglobin and hematocrit, blood     Status: Abnormal   Collection Time: 12/19/18  1:52 PM  Result Value Ref Range   Hemoglobin 7.1 (L) 12.0 - 15.0 g/dL   HCT 22.4 (L) 36.0 - 46.0 %    Comment: Performed at [facility], [place] [address], [place], [place] [address]  Troponin I - Now Then Q6H     Status: Abnormal   Collection Time: 12/19/18  1:52 PM  Result Value Ref Range   Troponin I 0.03 (HH) <0.03 ng/mL    Comment: CRITICAL VALUE NOTED.  VALUE IS CONSISTENT WITH PREVIOUSLY REPORTED AND CALLED VALUE. Performed at [facility], [place] [address] [place], [place] [address]      ROS:  Pertinent items noted in HPI and remainder of comprehensive ROS otherwise negative.  Physical Exam: Vitals:   12/19/18 0954 12/19/18 1348  BP: (!) 143/67 138/66  Pulse:  76  Resp:  16  Temp:  98.6 F (37 C)  SpO2:  96%     General exam: Appears calm and comfortable  Respiratory system: Clear to auscultation. Respiratory effort normal. No wheezing or crackle Cardiovascular system: S1 & S2 heard, RRR.  No pedal edema. Gastrointestinal system: Abdomen is nondistended, soft and nontender. Normal bowel sounds heard. Central nervous system: Alert and oriented. No focal neurological deficits. Extremities: Symmetric 5 x 5 power. Skin: No rashes, lesions or ulcers Psychiatry: Judgement and insight appear normal. Mood &  affect appropriate.     Sosie Gato Tanna Furry 12/19/2018, 3:29 PM  [facility].

## 2018-12-19 NOTE — Progress Notes (Addendum)
Pt currently receiving RBCs, last BP @ 1753 was 119/44. Nurse prior shift stated doctor is aware of patient's soft BP. Has order for Lasix after 1st unit of blood and before 2nd unit starts. Has contacted doctor so doctor is aware of BP, will check BP before giving Lasix and let doctor know to make sure its ok to go forth with Lasix administration.  Dawson Bills, RN  Lasix not given, patients BP 116/50 doctor notified. Patient has had two bowel movements that consist of blood, doctor notified and tech showed  BM that contained blood to doctor, doctor stated for tech to document BMs and appearance so GI can be aware in the AM. @ 2215

## 2018-12-20 DIAGNOSIS — R040 Epistaxis: Secondary | ICD-10-CM

## 2018-12-20 DIAGNOSIS — D62 Acute posthemorrhagic anemia: Secondary | ICD-10-CM

## 2018-12-20 DIAGNOSIS — R079 Chest pain, unspecified: Secondary | ICD-10-CM

## 2018-12-20 LAB — COMPREHENSIVE METABOLIC PANEL
ALBUMIN: 3.8 g/dL (ref 3.5–5.0)
ALT: 16 U/L (ref 0–44)
AST: 175 U/L — ABNORMAL HIGH (ref 15–41)
Alkaline Phosphatase: 261 U/L — ABNORMAL HIGH (ref 38–126)
Anion gap: 11 (ref 5–15)
BUN: 38 mg/dL — ABNORMAL HIGH (ref 8–23)
CO2: 20 mmol/L — ABNORMAL LOW (ref 22–32)
Calcium: 9 mg/dL (ref 8.9–10.3)
Chloride: 111 mmol/L (ref 98–111)
Creatinine, Ser: 2.43 mg/dL — ABNORMAL HIGH (ref 0.44–1.00)
GFR calc Af Amer: 22 mL/min — ABNORMAL LOW (ref >60)
GFR calc non Af Amer: 19 mL/min — ABNORMAL LOW (ref >60)
Glucose, Bld: 210 mg/dL — ABNORMAL HIGH (ref 70–99)
POTASSIUM: 4.8 mmol/L (ref 3.5–5.1)
Sodium: 142 mmol/L (ref 135–145)
Total Bilirubin: 1.4 mg/dL — ABNORMAL HIGH (ref 0.3–1.2)
Total Protein: 7 g/dL (ref 6.5–8.1)

## 2018-12-20 LAB — URINALYSIS, ROUTINE W REFLEX MICROSCOPIC
Bilirubin Urine: NEGATIVE
Glucose, UA: NEGATIVE mg/dL
Ketones, ur: NEGATIVE mg/dL
Leukocytes, UA: NEGATIVE
NITRITE: NEGATIVE
Protein, ur: NEGATIVE mg/dL
SPECIFIC GRAVITY, URINE: 1.012 (ref 1.005–1.030)
pH: 5 (ref 5.0–8.0)

## 2018-12-20 LAB — TYPE AND SCREEN
ABO/RH(D): A POS
Antibody Screen: NEGATIVE
Unit division: 0
Unit division: 0

## 2018-12-20 LAB — CBC WITH DIFFERENTIAL/PLATELET
Abs Immature Granulocytes: 0.01 10*3/uL (ref 0.00–0.07)
BASOS ABS: 0 10*3/uL (ref 0.0–0.1)
Basophils Relative: 1 %
Eosinophils Absolute: 0.4 10*3/uL (ref 0.0–0.5)
Eosinophils Relative: 10 %
HCT: 29.2 % — ABNORMAL LOW (ref 36.0–46.0)
Hemoglobin: 9.3 g/dL — ABNORMAL LOW (ref 12.0–15.0)
Immature Granulocytes: 0 %
Lymphocytes Relative: 20 %
Lymphs Abs: 0.7 10*3/uL (ref 0.7–4.0)
MCH: 30 pg (ref 26.0–34.0)
MCHC: 31.8 g/dL (ref 30.0–36.0)
MCV: 94.2 fL (ref 80.0–100.0)
Monocytes Absolute: 0.4 10*3/uL (ref 0.1–1.0)
Monocytes Relative: 10 %
NRBC: 0 % (ref 0.0–0.2)
Neutro Abs: 2.1 10*3/uL (ref 1.7–7.7)
Neutrophils Relative %: 59 %
Platelets: 92 10*3/uL — ABNORMAL LOW (ref 150–400)
RBC: 3.1 MIL/uL — ABNORMAL LOW (ref 3.87–5.11)
RDW: 17.3 % — AB (ref 11.5–15.5)
WBC: 3.7 10*3/uL — ABNORMAL LOW (ref 4.0–10.5)

## 2018-12-20 LAB — MITOCHONDRIAL ANTIBODIES: Mitochondrial M2 Ab, IgG: <20 Units (ref 0.0–20.0)

## 2018-12-20 LAB — BPAM RBC
Blood Product Expiration Date: 202002042359
Blood Product Expiration Date: 202002042359
ISSUE DATE / TIME: 202001131730
ISSUE DATE / TIME: 202001132303
Unit Type and Rh: 6200
Unit Type and Rh: 6200

## 2018-12-20 LAB — CULTURE, BODY FLUID W GRAM STAIN -BOTTLE: Culture: NO GROWTH

## 2018-12-20 LAB — AMMONIA: Ammonia: 52 umol/L — ABNORMAL HIGH (ref 9–35)

## 2018-12-20 LAB — GLUCOSE, CAPILLARY
Glucose-Capillary: 202 mg/dL — ABNORMAL HIGH (ref 70–99)
Glucose-Capillary: 194 mg/dL — ABNORMAL HIGH (ref 70–99)
Glucose-Capillary: 207 mg/dL — ABNORMAL HIGH (ref 70–99)
Glucose-Capillary: 189 mg/dL — ABNORMAL HIGH (ref 70–99)

## 2018-12-20 LAB — NA AND K (SODIUM & POTASSIUM), RAND UR
Potassium Urine: 22 mmol/L
Sodium, Ur: 88 mmol/L

## 2018-12-20 LAB — ANTINUCLEAR ANTIBODIES, IFA: ANA Ab, IFA: NEGATIVE

## 2018-12-20 LAB — CULTURE, BODY FLUID-BOTTLE

## 2018-12-20 LAB — ANTI-SMOOTH MUSCLE ANTIBODY, IGG: F-Actin IgG: 6 Units (ref 0–19)

## 2018-12-20 MED ORDER — MIDODRINE HCL 5 MG PO TABS
5.0000 mg | ORAL_TABLET | Freq: Three times a day (TID) | ORAL | Status: DC
  Administered 2018-12-21: 5 mg via ORAL
  Filled 2018-12-20 (×4): qty 1

## 2018-12-20 MED ORDER — ALBUMIN HUMAN 25 % IV SOLN
25.0000 g | Freq: Once | INTRAVENOUS | Status: DC
  Filled 2018-12-20: qty 100

## 2018-12-20 MED ORDER — LOPERAMIDE HCL 2 MG PO CAPS
2.0000 mg | ORAL_CAPSULE | Freq: Every day | ORAL | Status: DC
  Administered 2018-12-20 – 2018-12-22 (×2): 2 mg via ORAL
  Filled 2018-12-20 (×2): qty 1

## 2018-12-20 MED ORDER — PROCHLORPERAZINE EDISYLATE 10 MG/2ML IJ SOLN
10.0000 mg | Freq: Four times a day (QID) | INTRAMUSCULAR | Status: DC | PRN
  Administered 2018-12-20 – 2018-12-22 (×2): 10 mg via INTRAVENOUS
  Filled 2018-12-20 (×3): qty 2

## 2018-12-20 MED ORDER — LACTULOSE 10 GM/15ML PO SOLN
20.0000 g | Freq: Two times a day (BID) | ORAL | Status: DC
  Administered 2018-12-20 – 2018-12-21 (×2): 20 g via ORAL
  Filled 2018-12-20 (×2): qty 30

## 2018-12-20 MED ORDER — PANTOPRAZOLE SODIUM 40 MG PO TBEC
40.0000 mg | DELAYED_RELEASE_TABLET | Freq: Every day | ORAL | Status: DC
  Administered 2018-12-20 – 2018-12-22 (×3): 40 mg via ORAL
  Filled 2018-12-20 (×3): qty 1

## 2018-12-20 NOTE — Progress Notes (Addendum)
Laura Sampson NEPHROLOGY PROGRESS NOTE  Assessment/ Plan: Pt is a 73 y.o. 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 Recent worsening serum creatinine level likely due to liver cirrhosis/ascites requiring intermittent diuresis and paracentesis. ? HRS however urine Na was high ( 57 ?due to diuretics). The recent creatinine level around 1.5 -2. Today, creatinine level elevated to 2.43 likely due to anemia (epistaxis) and relative hypotension yesterday.  -UA unremarkable with no protein or cells.  -US renal consistent with CKD but no obstruction.  -urine output recorded only 300 cc since this morning, bladder scan unreliable due to ascites. I discussed with nurse for straight cath to r/o urinary retention.  -plan for paracentesis tomorrow, will hold on lasix for now. Start midodrine with parameters. Serum albumin level is acceptable. Repeat BMP in am and decide about lasix dose. Pt is having nausea and decreased oral intake. -Monitor BMP, urine output.  Avoid nephrotoxins -Patient needs close outpatient follow-up.  Per patient she was already referred to [facility].  #Hypertension: relative hypotension yesterday. Blood pressure acceptable today. She is on coreg.   #Epistaxis: seen by ENT, has nasal packing, no bleeding today. The bleeding has stopped now.  #Liver cirrhosis, ascites: GI is following. Abdomen is more distended today, plan for paracentesis tomorrow per primary team.  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  Iron saturation 14, ferritin is mildly elevated. Received PRBC and a dose of IV iron on 12/19/2018.  Monitor  CBC.  # Somnolent: Concern for hyperammonemia. Discussed with Dr. Grandville Silos, recommend to check serum ammonia. Level.   I have discussed at length with the patient's husband, son and the primary team. Thank you for the consult.  We will continue to follow with you.  Subjective: Seen and examined at bedside.  Patient is more somnolent today.  Reported nausea and abdominal discomfort/distention.  Minimal urine output.  Decreased oral intake. Objective Vital signs in last 24 hours: Vitals:   12/20/18 0942 12/20/18 1037 12/20/18 1226 12/20/18 1335  BP: (!) 170/80   (!) 130/58  Pulse: 78 (!) 57 74 62  Resp: 19   16  Temp: 98.2 F (36.8 C)   98.2 F (36.8 C)  TempSrc: Oral   Oral  SpO2: 95%   95%  Weight:      Height:       Weight change:   Intake/Output Summary (Last 24 hours) at 12/20/2018 1646 Last data filed at 12/20/2018 1300 Gross per 24 hour  Intake 1901.67 ml  Output 307 ml  Net 1594.67 ml       Labs: Basic Metabolic Panel: Recent Labs  Lab 12/18/18 0415 12/18/18 1344 12/19/18 0423 12/20/18 0540  NA 138  --  141 142  K 5.4* 4.7 4.8 4.8  CL 111  --  112* 111  CO2 21*  --  20* 20*  GLUCOSE 152*  --  199* 210*  BUN 33*  --  33* 38*  CREATININE 2.10*  --  2.22* 2.43*  CALCIUM 8.5*  --  9.0 9.0  PHOS 2.8  --   --   --    Liver Function Tests: Recent Labs  Lab 12/17/18 0459 12/18/18 0415 12/19/18  0423 12/20/18 0540  AST 160*  --  173* 175*  ALT 18  --  14 16  ALKPHOS 290*  --  286* 261*  BILITOT 0.6  --  0.9 1.4*  PROT 6.9  --  7.8 7.0  ALBUMIN 3.6 3.9 4.6 3.8   Recent Labs  Lab 12/14/18 2313  LIPASE 27   No results for input(s): AMMONIA in the last 168 hours. CBC: Recent Labs  Lab 12/14/18 2313  12/16/18 2307 12/17/18 0459 12/18/18 0415 12/19/18 0423 12/19/18 1352 12/20/18 0540  WBC 4.5   < > 3.2* 2.7* 2.8* 2.8*  --  3.7*  NEUTROABS 3.3  --   --   --   --   --   --  2.1  HGB 10.1*   < > 8.8* 8.0* 8.0* 7.6* 7.1* 9.3*  HCT 31.9*   < >  28.1* 25.4* 25.5* 24.2* 22.4* 29.2*  MCV 96.7   < > 94.6 96.2 98.1 98.8  --  94.2  PLT 134*   < > 120* 90* 96* 95*  --  92*   < > = values in this interval not displayed.   Cardiac Enzymes: Recent Labs  Lab 12/19/18 0815 12/19/18 1352 12/19/18 1920  TROPONINI 0.04* 0.03* 0.03*   CBG: Recent Labs  Lab 12/19/18 1619 12/19/18 2126 12/20/18 0748 12/20/18 1200 12/20/18 1611  GLUCAP 256* 203* 202* 194* 207*    Iron Studies: No results for input(s): IRON, TIBC, TRANSFERRIN, FERRITIN in the last 72 hours. Studies/Results: Dg Chest Port 1 View  Result Date: 12/19/2018 CLINICAL DATA:  Chest tightness. EXAM: PORTABLE CHEST 1 VIEW COMPARISON:  11/30/2017 FINDINGS: Decreased lung volumes compared to the previous examination. Hazy densities in the left lower chest. Upper lungs are clear. Heart size remains enlarged with evidence of previous cardiac surgery. Stable appearance of the right-sided dual chamber cardiac pacemaker. No acute bone abnormality. IMPRESSION: Low lung volumes with hazy densities at the left lung base. Left basilar densities may be related to atelectasis. Subtle infection can not be excluded. Electronically Signed   By: Markus Daft M.D.   On: 12/19/2018 08:43    Medications: Infusions:   Scheduled Medications: . allopurinol  200 mg Oral Daily  . calcitRIOL  0.25 mcg Oral Daily  . carvedilol  3.125 mg Oral BID WC  . digoxin  0.125 mg Oral Daily  . doxycycline  100 mg Oral Q12H  . gabapentin  100 mg Oral BID  . insulin aspart  0-9 Units Subcutaneous TID WC  . levothyroxine  75 mcg Oral QAC breakfast  . loperamide  2 mg Oral QAC breakfast  . magnesium oxide  400 mg Oral Daily  . midodrine  5 mg Oral TID WC  . pantoprazole  40 mg Oral Q0600  . sodium bicarbonate  650 mg Oral BID    have reviewed scheduled and prn medications.  Physical Exam: General: Not in distress, comfortable Heart: Regular rate rhythm S1-S2 normal Lungs: Clear bilateral, no crackle or  wheeze Abdomen: Abdomen distended, firm, nontender Extremities:No edema Neurology: Alert awake and following command, mostly somnolent   Dron Prasad Bhandari 12/20/2018,4:46 PM  LOS: 4 days

## 2018-12-20 NOTE — Care Management Note (Signed)
Case Management Note  Patient Details  Name: Laura Sampson MRN: [id number] Date of Birth: [birth year]  Subjective/Objective: [facility] will provide HHC-already ordered.Ordered for home rw-[facility] will be able to provide rw @ d/c.                    Action/Plan:dc home w/HHC/dme   Expected Discharge Date:                  Expected Discharge Plan:  [place]  In-House Referral:     Discharge planning Services  CM Consult  Post Acute Care Choice:  Home Health(Active w/[facility] HHRN/HHPT) Choice offered to:  Patient  DME Arranged:  [facility] DME Agency:  [place]:  RN, PT, OT, Nurse's Aide, Social Work [employer] Agency:     Status of Service:  Completed, signed off  If discussed at [employer], dates discussed:    Additional Comments:  Dessa Phi, RN 12/20/2018, 2:06 PM

## 2018-12-20 NOTE — Progress Notes (Signed)
PT Cancellation Note  Patient Details Name: Laura Sampson MRN: [id number] DOB: [birth year]   Cancelled Treatment:    Reason Eval/Treat Not Completed: Fatigue/lethargy limiting ability to participate; reports fatigued after getting blood overnight.  Requested PT return later today.  Will attempt again as schedule allows.   Reginia Naas 12/20/2018, 11:40 AM  Magda Kiel, [place] [phone] 12/20/2018

## 2018-12-20 NOTE — Progress Notes (Signed)
PT Cancellation Note  Patient Details Name: Laura Sampson MRN: [id number] DOB: [birth year]   Cancelled Treatment:    Reason Eval/Treat Not Completed: Fatigue/lethargy limiting ability to participate; patient in bed and family reports she got up earlier and washed up and got up to chair and walked a little way in hallway prior to fatigue.  Report she is too tired now to participate.  Encouraged up in chair for dinner later.  Will continue attempts.   Reginia Naas 12/20/2018, 4:19 PM  Magda Kiel, [place] [phone] 12/20/2018

## 2018-12-20 NOTE — Progress Notes (Signed)
Subjective: No new complaint. No more bleeding overnight.  Objective: Vital signs in last 24 hours: Temp:  [97.6 F (36.4 C)-98.7 F (37.1 C)] 98.2 F (36.8 C) (01/14 0942) Pulse Rate:  [42-80] 74 (01/14 1226) Resp:  [16-19] 19 (01/14 0942) BP: (112-170)/(44-80) 170/80 (01/14 0942) SpO2:  [93 %-98 %] 95 % (01/14 0942)  Physical Exam: CMK:[id number].No acute distress  Eyes: Her pupils are equal, round, reactive to light. Extraocular motion is intact.  Ears: Examination of the ears shows normal auricles and external auditory canals bilaterally.  Nose: Nasal examination shows a rhinorocket packing in the left nasal cavity. No acute bleeding at this time. Face: Facial examination shows no asymmetry. Palpation of the face elicit no significant tenderness.  Mouth: Oral cavity examination shows no mucosal lacerations. No significant trismus is noted. No blood in pharynx. Neck: Palpation of the neck reveals no lymphadenopathy or mass. The trachea is midline.  Neuro: Cranial nerves 2-12 are grossly intact.  Recent Labs    12/19/18 0423 12/19/18 1352 12/20/18 0540  WBC 2.8*  --  3.7*  HGB 7.6* 7.1* 9.3*  HCT 24.2* 22.4* 29.2*  PLT 95*  --  92*   Recent Labs    12/19/18 0423 12/20/18 0540  NA 141 142  K 4.8 4.8  CL 112* 111  CO2 20* 20*  GLUCOSE 199* 210*  BUN 33* 38*  CREATININE 2.22* 2.43*  CALCIUM 9.0 9.0    Medications:  I have reviewed the patient's current medications. Scheduled: . allopurinol  200 mg Oral Daily  . calcitRIOL  0.25 mcg Oral Daily  . carvedilol  3.125 mg Oral BID WC  . digoxin  0.125 mg Oral Daily  . doxycycline  100 mg Oral Q12H  . furosemide  40 mg Oral Daily  . gabapentin  100 mg Oral BID  . insulin aspart  0-9 Units Subcutaneous TID WC  . levothyroxine  75 mcg Oral QAC breakfast  . loperamide  2 mg Oral QAC breakfast  . magnesium oxide  400 mg Oral Daily  . pantoprazole  40 mg Oral Q0600  . sodium bicarbonate  650 mg Oral BID      Assessment/Plan: Left epistaxis, now control with left nasal packing. - Hold eliquis for now. - On doxycycline. - Will leave the packing in place until later this week. - Will follow.   LOS: 4 days   Nima Bamburg W Kaleem [place] 12/20/2018, 12:28 PM

## 2018-12-20 NOTE — Care Management Important Message (Signed)
Important Message  Patient Details  Name: HEILEY SHAIKH MRN: [id number] Date of Birth: December 31, 1945   Medicare Important Message Given:  Yes    Kerin Salen 12/20/2018, 10:58 AMImportant Message  Patient Details  Name: WANNETTA LANGLAND MRN: [id number] Date of Birth: 02-10-1946   Medicare Important Message Given:  Yes    Kerin Salen 12/20/2018, 10:56 AM

## 2018-12-20 NOTE — Progress Notes (Signed)
[facility] Progress Note    Since last GI note: Right nare bleeding was stopped with ENT balloon packing.  She received 2 units blood overnight with good bump in Hb  Objective: Vital signs in last 24 hours: Temp:  [97.6 F (36.4 C)-98.7 F (37.1 C)] 98.2 F (36.8 C) (01/14 0607) Pulse Rate:  [42-80] 80 (01/14 0607) Resp:  [16-18] 16 (01/14 0607) BP: (112-145)/(44-67) 130/62 (01/14 0607) SpO2:  [93 %-98 %] 95 % (01/14 0607) Last BM Date: 12/18/18 General: alert and oriented times 3 Heart: regular rate and rythm Abdomen: soft, non-tender, moderately distended, normal bowel sounds No lower extremity edema   Lab Results: Recent Labs    12/18/18 0415 12/19/18 0423 12/19/18 1352 12/20/18 0540  WBC 2.8* 2.8*  --  3.7*  HGB 8.0* 7.6* 7.1* 9.3*  PLT 96* 95*  --  92*  MCV 98.1 98.8  --  94.2   Recent Labs    12/18/18 0415 12/18/18 1344 12/19/18 0423 12/20/18 0540  NA 138  --  141 142  K 5.4* 4.7 4.8 4.8  CL 111  --  112* 111  CO2 21*  --  20* 20*  GLUCOSE 152*  --  199* 210*  BUN 33*  --  33* 38*  CREATININE 2.10*  --  2.22* 2.43*  CALCIUM 8.5*  --  9.0 9.0   Recent Labs    12/18/18 0415 12/19/18 0423 12/20/18 0540  PROT  --  7.8 7.0  ALBUMIN 3.9 4.6 3.8  AST  --  173* 175*  ALT  --  14 16  ALKPHOS  --  286* 261*  BILITOT  --  0.9 1.4*   Recent Labs    12/19/18 0815  INR 1.41    Medications: Scheduled Meds: . allopurinol  200 mg Oral Daily  . calcitRIOL  0.25 mcg Oral Daily  . carvedilol  3.125 mg Oral BID WC  . digoxin  0.125 mg Oral Daily  . doxycycline  100 mg Oral Q12H  . furosemide  40 mg Oral Daily  . gabapentin  100 mg Oral BID  . insulin aspart  0-9 Units Subcutaneous TID WC  . levothyroxine  75 mcg Oral QAC breakfast  . magnesium oxide  400 mg Oral Daily  . pantoprazole (PROTONIX) IV  40 mg Intravenous Q12H  . sodium bicarbonate  650 mg Oral BID   Continuous Infusions: PRN Meds:.acetaminophen **OR** acetaminophen,  loperamide, ondansetron **OR** ondansetron (ZOFRAN) IV, oxyCODONE, oxymetazoline, phenylephrine    Assessment/Plan: 73 y.o. female with cirrhosis, ascites, MM, renal insufficiency  Epistaxis has stopped with balloon packing.  Blood thinner currently held.  ENT following.  Hb very good bump from 7 to 9 after two units blood transfusion.  She does not need IV PPI. I will change to once daily oral dosing (she was not on any antiacid meds prior to admission).  Cr up a bit from yesterday. Nephrology input noted, they doubt hepatorenal syndrome.  Lasix 23m orally once daily started yesterday.  Her fluid management is proving to be quite difficult, greatly appreciate nephrology input.  AScites was tapped twice but SAAG labs not sent, the fluid did not show any sign of SBP.  Current MELD-Na is 20.  Workup of her cirrhosis etiology ongoing: ANA, AMA, ASMA all sent this morning. Viral testing was all negative.  She has chronically loose stools, worse during a C. Diff infection recently. Early this admission C. Diff stool testing was negative. She sill has intermittently loose stools. I  am going to put her on scheduled daily imodium (rather than PRN).  She and her husband tell me she had a normal colonoscopy about a year ago (done for routine screening I believe).  We will try to get those records here for review because she wants to establish care locally.  She also had an EGD about a year ago, not sure why but she tells me it was normal.  We will try to get those records here for review as well.  I don't  I'm ordering heart healthy (low salt) diet.  Low salt diet absolutely helps control fluid status in setting of cirrhosis.  Will follow along.  Milus Banister, MD  12/20/2018, 7:41 AM [facility] Pager [phone]

## 2018-12-20 NOTE — Progress Notes (Signed)
PROGRESS NOTE    Laura Sampson  MBW:[id number] DOB: 1946-08-12 DOA: 12/14/2018 PCP: System, Pcp Not In    Brief Narrative:  Per [name] is a 73 y.o. female with medical history significant of DM2, HTN, CAD s/p CABG, PPM.  Recent diagnosis of MM.  Patient recently hospitalized in OSH at end of December for abd pain, swelling, diarrhea.  Work up during hospital stay showed cirrhotic appearance of liver, ascites, though not enough ascites apparently to tap when they tried.  Stool came back positive for C.Diff.  Patient was started on PO vanc, bumex, and aldactone.  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  Also saw a 2.3cm right lobe hepatic mass on [place] 12/21, though the same mass couldn't be visualized on repeat US x5 days later on the 26th.   ED Course: ALK 454, Creat 2.0 AST 210 ALT 27 (about what they had been running at OSH).  CT abd/pelvis shows: Moderate to large volume of ascites with soft tissue anasarca.    Assessment & Plan:   Principal Problem:   Clostridium difficile diarrhea Active Problems:   Epistaxis   Liver mass   Cirrhosis (HCC)   Ascites   DM2 (diabetes mellitus, type 2) (HCC)   HTN (hypertension)   PAF (paroxysmal atrial fibrillation) (HCC)   CKD (chronic kidney disease) stage 4, GFR 15-29 ml/min (HCC)   Multiple myeloma not having achieved remission (HCC)   ARF (acute renal failure) (HCC)   Chest tightness   Acute blood loss anemia   Chest pain  1 diarrhea/recent diagnosis of C. difficile colitis at outside hospital Patient noted to have recently been diagnosed with a C. difficile colitis at outside hospital.  Patient still with complaints of multiple loose stools.  C. difficile PCR was repeated which was negative.  Pharmacy noted that patient has 12/15/2018 had had a full 14-day course of treatment for C. difficile colitis.  Oral vancomycin has been discontinued.  Bumex and spironolactone on hold.  GI pathogen panel  negative.  Imodium as needed. Follow.  #2 epistaxis Patient with epistaxis since approximately 2 AM last night.  Patient was ordered some Afrin nasal spray as well as Neo-Synephrine nasal spray with gauze packing however patient with ongoing bleeding the morning of 12/19/2018 with bleeding of left nare with some bleeding coming from the left eye.  Per son patient with prior history of nasal surgery secondary to sinusitis approximately 2 years ago at [facility] in [place].  Afrin nasal spray as needed.  Tranexamic acid to left nare.  Nasal packing.  Due to patient's significant bleeding, ED physician came and placed a nasal tampon.  Patient currently with no further epistaxis.  ENT is consulted and is following the patient and recommending continuing nasal tampon for at least 5 days.  Patient started on doxycycline while nasal tampon in place.  Eliquis has been discontinued.  ENT following and appreciate input and recommendations.  3.  Chest pain Patient with complaints of chest tightness.  Patient with crackles noted on examination on 12/19/2018.  Troponin was 0.03.  Patient given IV Lasix with clinical improvement. Follow.  4.  Cirrhosis with ascites/abdominal pain Patient status post ultrasound-guided paracentesis with 3.2 L of hazy fluid removed.  Gram stain and cultures negative to date.  Doubt if patient has SBP.  Patient recurrence of ascites again.  Patient initially had some clinical improvement after initial paracentesis.  Fluid building back up. Urinalysis nitrite negative, leukocytes negative, protein  negative.  Bumex and spironolactone on hold due to probable dehydration from multiple episodes of diarrhea and slight bump in creatinine.  Acute hepatitis panel is negative.  Patient status post IV albumin x2 days.  With a EF of 65 to 70%, moderate LVH, incoordinate septal motion, moderate MR, normal LA size, severe TR, while catheter is seen in the right heart extending towards the  right ventricular free wall, mildly dilated RV with normal systolic function, severe TR, normal IVC.  Will reorder ultrasound-guided paracentesis with no more than 3 to 4 L of fluid to be removed due to patient's recurrent ascites/portal vein thrombosis.  Patient also noted with some confusion per nephrology and as such we will order an ammonia level.  If ammonia level is elevated will place on lactulose.  Patient noted to have significant bouts of melanotic stools yesterday and per GI may be secondary to significant epistaxis with swallowing of blood.  Patient status post 2 units packed red blood cells hemoglobin appropriately elevated at 9.2.  Follow H&H.  Work-up for cirrhosis underway and pending.  GI following and appreciate input and recommendations.    5.  Diabetes mellitus type 2 Patient noted to have a hypoglycemic episode with a CBG of 34 on 12/16/2018, which improved after orange juice and packets of sugar.  Hemoglobin A1c 8.7.  CBG of 202 this morning.  70/30 has been discontinued.  Continue sliding scale insulin.  Once oral intake improves may consider starting low-dose long-acting insulin.  6.  Chronic kidney disease stage IV Baseline creatinine approximately 2.  Creatinine currently at 2.43 from 2.22 from 2.10 from 2.27 from 2.56.  Urinalysis negative for proteinuria.  Patient with slight bump in creatinine on 12/17/2018, may be secondary to prerenal azotemia from multiple episodes of diarrhea and now also from significant blood loss due to epistaxis. Continue to hold diuretics of Bumex and spironolactone.  Continue calcitriol.  Saline lock IV fluids.  Patient given some IV Lasix yesterday due to complaints of chest tightness with clinical improvement.  Nephrology following and patient initially started on oral Lasix daily which is going to be held due to concerns for some volume depletion.  Nephrology following and appreciate input and recommendations.   7.??  Liver mass Initial concern for  2.3 cm right lobe mass concerning for neoplasm seen on right upper quadrant ultrasound 11/26/2018.  Repeat ultrasound 5 days later could not see a mass.  Patient unable to have a MRI due to PPM.  CT abdomen and pelvis done during this hospitalization without contrast with no mention of a liver mass.  Acute hepatitis panel negative.  GI has been consulted and are following.   8.  Multiple myeloma Outpatient follow-up with oncology.  9.  Paroxysmal atrial fibrillation Continue Coreg and digoxin.  Eliquis was held and patient placed on a heparin drip in anticipation of ultrasound-guided paracentesis.  Patient status post paracentesis.  Heparin drip discontinued as patient noted to have a bout of epistaxis the night of 12/16/2018.  Resumed Eliquis at 2.5 mg twice daily as dosed per pharmacy due to patient's renal function and history of cirrhosis.  Patient now with epistaxis the early hours of 12/19/2018 and lost a significant amount of blood..  Discontinued Eliquis and will not resume at this time.  10.  Hypertension Blood pressure was somewhat borderline.  Patient had significant blood loss on 12/19/2018.  Hydralazine discontinued overnight.  Bumex and spironolactone on hold.  Patient started on Lasix per nephrology.  Continue beta-blocker.  11.  Hyperkalemia Kayexalate x1.  Repeat potassium with resolution of hyperkalemia.  Follow.  12.  Acute blood loss anemia Secondary to significant epistaxis.  Up as low as 7.1.  Status post 2 units packed red blood cells hemoglobin currently at 9.3.  Follow.   DVT prophylaxis: SCDs.  Patient with epistaxis. Code Status: Full Family Communication: Updated patient and son at bedside.  Disposition Plan: Home when okay with nephrology and gastroenterology.     Consultants:   GI: Dr. Carlean Purl 12/18/2018  ENT: Dr. Benjamine Mola 12/19/2018  Nephrology: Dr. Carolin Sicks 12/19/2018    Procedures:   Ultrasound-guided paracentesis--3.2 L of hazy amber fluid removed per  interventional radiology 12/15/2018  CT renal stone protocol 12/14/2018  2 units packed red blood cells 12/19/2018  Antimicrobials:   Doxycycline 12/19/2018   Subjective: Patient with no further nasal bleeding.  Nasal tampon in place.  Patient with abdominal distention.  Patient noted to have significant large bloody bowel movements overnight and early this morning.  Patient states chest tightness has improved.  Denies any shortness of breath.   Objective: Vitals:   12/20/18 1037 12/20/18 1226 12/20/18 1335 12/20/18 2028  BP:   (!) 130/58 (!) 140/58  Pulse: (!) 57 74 62 73  Resp:   16 16  Temp:   98.2 F (36.8 C) 99.2 F (37.3 C)  TempSrc:   Oral Oral  SpO2:   95% 92%  Weight:      Height:        Intake/Output Summary (Last 24 hours) at 12/20/2018 2118 Last data filed at 12/20/2018 1800 Gross per 24 hour  Intake 930 ml  Output 805 ml  Net 125 ml   Filed Weights   12/15/18 0321  Weight: 68 kg    Examination:  General exam: NAD. Respiratory system: Lungs clear to auscultation bilaterally.  No rhonchi, no wheezing.  Normal respiratory effort. HEENT: Patient with no further nasal bleeding.  Nasal tampon in place.  Cardiovascular system: Regular rate rhythm no murmurs rubs or gallops.  No JVD.  No lower extremity edema.  Gastrointestinal system: Abdomen is distended, some tender to palpation in the epigastrium and right upper quadrant.  Positive bowel sounds.  Somewhat tight.  No rebound.  No guarding.  Central nervous system: Alert and oriented. No focal neurological deficits. Extremities: Symmetric 5 x 5 power. Skin: No rashes, lesions or ulcers Psychiatry: Judgement and insight appear normal. Mood & affect appropriate.     Data Reviewed: I have personally reviewed following labs and imaging studies  CBC: Recent Labs  Lab 12/14/18 2313  12/16/18 2307 12/17/18 0459 12/18/18 0415 12/19/18 0423 12/19/18 1352 12/20/18 0540  WBC 4.5   < > 3.2* 2.7* 2.8* 2.8*  --   3.7*  NEUTROABS 3.3  --   --   --   --   --   --  2.1  HGB 10.1*   < > 8.8* 8.0* 8.0* 7.6* 7.1* 9.3*  HCT 31.9*   < > 28.1* 25.4* 25.5* 24.2* 22.4* 29.2*  MCV 96.7   < > 94.6 96.2 98.1 98.8  --  94.2  PLT 134*   < > 120* 90* 96* 95*  --  92*   < > = values in this interval not displayed.   Basic Metabolic Panel: Recent Labs  Lab 12/16/18 0147 12/17/18 0459 12/18/18 0415 12/18/18 1344 12/19/18 0423 12/20/18 0540  NA 139 138 138  --  141 142  K 4.3 4.5 5.4* 4.7 4.8 4.8  CL 110  109 111  --  112* 111  CO2 22 22 21*  --  20* 20*  GLUCOSE 106* 179* 152*  --  199* 210*  BUN 43* 36* 33*  --  33* 38*  CREATININE 2.56* 2.27* 2.10*  --  2.22* 2.43*  CALCIUM 8.5* 8.4* 8.5*  --  9.0 9.0  MG 2.1  --   --   --   --   --   PHOS  --   --  2.8  --   --   --    GFR: Estimated Creatinine Clearance: 18.9 mL/min (A) (by C-G formula based on SCr of 2.43 mg/dL (H)). Liver Function Tests: Recent Labs  Lab 12/14/18 2313 12/16/18 0147 12/17/18 0459 12/18/18 0415 12/19/18 0423 12/20/18 0540  AST 210* 176* 160*  --  173* 175*  ALT 27 21 18   --  14 16  ALKPHOS 454* 359* 290*  --  286* 261*  BILITOT 0.7 0.6 0.6  --  0.9 1.4*  PROT 8.2* 6.9 6.9  --  7.8 7.0  ALBUMIN 3.0* 2.4* 3.6 3.9 4.6 3.8   Recent Labs  Lab 12/14/18 2313  LIPASE 27   Recent Labs  Lab 12/20/18 1721  AMMONIA 52*   Coagulation Profile: Recent Labs  Lab 12/19/18 0815  INR 1.41   Cardiac Enzymes: Recent Labs  Lab 12/19/18 0815 12/19/18 1352 12/19/18 1920  TROPONINI 0.04* 0.03* 0.03*   BNP (last 3 results) No results for input(s): PROBNP in the last 8760 hours. HbA1C: No results for input(s): HGBA1C in the last 72 hours. CBG: Recent Labs  Lab 12/19/18 1619 12/19/18 2126 12/20/18 0748 12/20/18 1200 12/20/18 1611  GLUCAP 256* 203* 202* 194* 207*   Lipid Profile: No results for input(s): CHOL, HDL, LDLCALC, TRIG, CHOLHDL, LDLDIRECT in the last 72 hours. Thyroid Function Tests: No results for input(s):  TSH, T4TOTAL, FREET4, T3FREE, THYROIDAB in the last 72 hours. Anemia Panel: No results for input(s): VITAMINB12, FOLATE, FERRITIN, TIBC, IRON, RETICCTPCT in the last 72 hours. Sepsis Labs: No results for input(s): PROCALCITON, LATICACIDVEN in the last 168 hours.  Recent Results (from the past 240 hour(s))  Gastrointestinal Panel by PCR , Stool     Status: None   Collection Time: 12/15/18 11:11 AM  Result Value Ref Range Status   Campylobacter species NOT DETECTED NOT DETECTED Final   Plesimonas shigelloides NOT DETECTED NOT DETECTED Final   Salmonella species NOT DETECTED NOT DETECTED Final   Yersinia enterocolitica NOT DETECTED NOT DETECTED Final   Vibrio species NOT DETECTED NOT DETECTED Final   Vibrio cholerae NOT DETECTED NOT DETECTED Final   Enteroaggregative E coli (EAEC) NOT DETECTED NOT DETECTED Final   Enteropathogenic E coli (EPEC) NOT DETECTED NOT DETECTED Final   Enterotoxigenic E coli (ETEC) NOT DETECTED NOT DETECTED Final   Shiga like toxin producing E coli (STEC) NOT DETECTED NOT DETECTED Final   Shigella/Enteroinvasive E coli (EIEC) NOT DETECTED NOT DETECTED Final   Cryptosporidium NOT DETECTED NOT DETECTED Final   Cyclospora cayetanensis NOT DETECTED NOT DETECTED Final   Entamoeba histolytica NOT DETECTED NOT DETECTED Final   Giardia lamblia NOT DETECTED NOT DETECTED Final   Adenovirus F40/41 NOT DETECTED NOT DETECTED Final   Astrovirus NOT DETECTED NOT DETECTED Final   Norovirus GI/GII NOT DETECTED NOT DETECTED Final   Rotavirus A NOT DETECTED NOT DETECTED Final   Sapovirus (I, II, IV, and V) NOT DETECTED NOT DETECTED Final    Comment: Performed at [facility], [address] [place], [place] [address]  C difficile quick scan w PCR reflex     Status: None   Collection Time: 12/15/18 11:11 AM  Result Value Ref Range Status   C Diff antigen NEGATIVE NEGATIVE Final   C Diff toxin NEGATIVE NEGATIVE Final   C Diff interpretation No C. difficile detected.   Final    Comment: Performed at [facility], [place] [address], [place], [place] [address]  Culture, body fluid-bottle     Status: None   Collection Time: 12/15/18  3:58 PM  Result Value Ref Range Status   Specimen Description FLUID PERITONEAL  Final   Special Requests BOTTLES DRAWN AEROBIC AND ANAEROBIC  Final   Culture   Final    NO GROWTH 5 DAYS Performed at [facility], [place] [address], [place], [place] [address]    Report Status 12/20/2018 FINAL  Final  Gram stain     Status: None   Collection Time: 12/15/18  3:58 PM  Result Value Ref Range Status   Specimen Description FLUID PERITONEAL  Final   Special Requests NONE  Final   Gram Stain   Final    CYTOSPIN SMEAR NO WBC SEEN NO ORGANISMS SEEN Performed at [facility], [place] [address], [place], [place] [address]    Report Status 12/16/2018 FINAL  Final  Culture, Urine     Status: Abnormal   Collection Time: 12/16/18  6:38 AM  Result Value Ref Range Status   Specimen Description URINE, CLEAN CATCH  Final   Special Requests   Final    NONE Performed at [facility] [address], [place], [place] [address]    Culture (A)  Final    80,000 COLONIES/mL MULTIPLE SPECIES PRESENT, SUGGEST RECOLLECTION   Report Status 12/17/2018 FINAL  Final         Radiology Studies: Dg Chest Port 1 View  Result Date: 12/19/2018 CLINICAL DATA:  Chest tightness. EXAM: PORTABLE CHEST 1 VIEW COMPARISON:  11/30/2017 FINDINGS: Decreased lung volumes compared to the previous examination. Hazy densities in the left lower chest. Upper lungs are clear. Heart size remains enlarged with evidence of previous cardiac surgery. Stable appearance of the right-sided dual chamber cardiac pacemaker. No acute bone abnormality. IMPRESSION: Low lung volumes with hazy densities at the left lung base. Left basilar densities may be related to atelectasis. Subtle infection can not be excluded. Electronically Signed   By:  Markus Daft M.D.   On: 12/19/2018 08:43        Scheduled Meds: . allopurinol  200 mg Oral Daily  . calcitRIOL  0.25 mcg Oral Daily  . carvedilol  3.125 mg Oral BID WC  . digoxin  0.125 mg Oral Daily  . doxycycline  100 mg Oral Q12H  . gabapentin  100 mg Oral BID  . insulin aspart  0-9 Units Subcutaneous TID WC  . levothyroxine  75 mcg Oral QAC breakfast  . loperamide  2 mg Oral QAC breakfast  . magnesium oxide  400 mg Oral Daily  . midodrine  5 mg Oral TID WC  . pantoprazole  40 mg Oral Q0600  . sodium bicarbonate  650 mg Oral BID   Continuous Infusions:    LOS: 4 days    Time spent: 40 minutes    Irine Seal, MD [facility]  If 7PM-7AM, please contact night-coverage [email or web address] 12/20/2018, 9:18 PM

## 2018-12-20 NOTE — Progress Notes (Signed)
Inpatient Diabetes Program Recommendations  AACE/ADA: New Consensus Statement on Inpatient Glycemic Control (2015)  Target Ranges:  Prepandial:   less than 140 mg/dL      Peak postprandial:   less than 180 mg/dL (1-2 hours)      Critically ill patients:  140 - 180 mg/dL   Results for Laura Sampson, Laura Sampson (MRN [id number]) as of 12/20/2018 08:54  Ref. Range 12/19/2018 07:38 12/19/2018 11:44 12/19/2018 16:19 12/19/2018 21:26  Glucose-Capillary Latest Ref Range: 70 - 99 mg/dL 207 (H)  3 units NOVOLOG  186 (H)  2 units NOVOLOG  256 (H)  5 units NOVOLOG  203 (H)   Results for Laura Sampson, Laura Sampson (MRN [id number]) as of 12/20/2018 08:54  Ref. Range 12/20/2018 07:48  Glucose-Capillary Latest Ref Range: 70 - 99 mg/dL 202 (H)  3 units NOVOLOG      Home DM Meds: NPH Insulin 45 units BID       Humalog 8 units TID with meals  Current Orders: Novolog Sensitive Correction Scale/ SSI (0-9 units) TID AC     Now eating 25-75% of meals per documentation.  Takes NPH Insulin at home.  CBGs now running the in the 200s.     MD- Please consider starting a low dose of basal insulin for patient based on weight.    Can resume NPH for home at time of discharge.  Recommend Lantus 10 units QHS (0.15 units/kg dosing based on weight of 68 kg)     --Will follow patient during hospitalization--  Wyn Quaker RN, MSN, CDE Diabetes Coordinator Inpatient Glycemic Control Team Team Pager: [phone] (8a-5p)

## 2018-12-21 ENCOUNTER — Inpatient Hospital Stay: Payer: Medicare Other | Admitting: Hematology

## 2018-12-21 ENCOUNTER — Telehealth: Payer: Self-pay | Admitting: *Deleted

## 2018-12-21 ENCOUNTER — Inpatient Hospital Stay (HOSPITAL_COMMUNITY): Payer: Medicare Other

## 2018-12-21 DIAGNOSIS — R040 Epistaxis: Secondary | ICD-10-CM

## 2018-12-21 LAB — CBC WITH DIFFERENTIAL/PLATELET
Abs Immature Granulocytes: 0.01 10*3/uL (ref 0.00–0.07)
Basophils Absolute: 0 10*3/uL (ref 0.0–0.1)
Basophils Relative: 1 %
Eosinophils Absolute: 0.2 10*3/uL (ref 0.0–0.5)
Eosinophils Relative: 6 %
HCT: 28.7 % — ABNORMAL LOW (ref 36.0–46.0)
Hemoglobin: 9.2 g/dL — ABNORMAL LOW (ref 12.0–15.0)
Immature Granulocytes: 0 %
Lymphocytes Relative: 15 %
Lymphs Abs: 0.6 10*3/uL — ABNORMAL LOW (ref 0.7–4.0)
MCH: 30.8 pg (ref 26.0–34.0)
MCHC: 32.1 g/dL (ref 30.0–36.0)
MCV: 96 fL (ref 80.0–100.0)
Monocytes Absolute: 0.5 10*3/uL (ref 0.1–1.0)
Monocytes Relative: 12 %
NRBC: 0 % (ref 0.0–0.2)
Neutro Abs: 2.8 10*3/uL (ref 1.7–7.7)
Neutrophils Relative %: 66 %
PLATELETS: 98 10*3/uL — AB (ref 150–400)
RBC: 2.99 MIL/uL — ABNORMAL LOW (ref 3.87–5.11)
RDW: 17.9 % — ABNORMAL HIGH (ref 11.5–15.5)
WBC: 4.2 10*3/uL (ref 4.0–10.5)

## 2018-12-21 LAB — RENAL FUNCTION PANEL
Albumin: 3.8 g/dL (ref 3.5–5.0)
Anion gap: 11 (ref 5–15)
BUN: 39 mg/dL — ABNORMAL HIGH (ref 8–23)
CO2: 23 mmol/L (ref 22–32)
Calcium: 9.2 mg/dL (ref 8.9–10.3)
Chloride: 109 mmol/L (ref 98–111)
Creatinine, Ser: 2.78 mg/dL — ABNORMAL HIGH (ref 0.44–1.00)
GFR calc non Af Amer: 16 mL/min — ABNORMAL LOW (ref >60)
GFR, EST AFRICAN AMERICAN: 19 mL/min — AB (ref >60)
Glucose, Bld: 196 mg/dL — ABNORMAL HIGH (ref 70–99)
Phosphorus: 3.5 mg/dL (ref 2.5–4.6)
Potassium: 4.7 mmol/L (ref 3.5–5.1)
Sodium: 143 mmol/L (ref 135–145)

## 2018-12-21 LAB — HEPATIC FUNCTION PANEL
ALT: 16 U/L (ref 0–44)
AST: 185 U/L — ABNORMAL HIGH (ref 15–41)
Albumin: 3.8 g/dL (ref 3.5–5.0)
Alkaline Phosphatase: 262 U/L — ABNORMAL HIGH (ref 38–126)
Bilirubin, Direct: 0.4 mg/dL — ABNORMAL HIGH (ref 0.0–0.2)
Indirect Bilirubin: 1.3 mg/dL — ABNORMAL HIGH (ref 0.3–0.9)
Total Bilirubin: 1.7 mg/dL — ABNORMAL HIGH (ref 0.3–1.2)
Total Protein: 7 g/dL (ref 6.5–8.1)

## 2018-12-21 LAB — BODY FLUID CELL COUNT WITH DIFFERENTIAL
Eos, Fluid: 2 %
Lymphs, Fluid: 60 %
MONOCYTE-MACROPHAGE-SEROUS FLUID: 27 % — AB (ref 50–90)
Neutrophil Count, Fluid: 11 % (ref 0–25)
Total Nucleated Cell Count, Fluid: 138 cu mm (ref 0–1000)

## 2018-12-21 LAB — PROTEIN, PLEURAL OR PERITONEAL FLUID: Total protein, fluid: <3 g/dL

## 2018-12-21 LAB — LACTATE DEHYDROGENASE, PLEURAL OR PERITONEAL FLUID: LD, Fluid: 58 U/L — ABNORMAL HIGH (ref 3–23)

## 2018-12-21 LAB — AMMONIA: Ammonia: 51 umol/L — ABNORMAL HIGH (ref 9–35)

## 2018-12-21 LAB — GLUCOSE, CAPILLARY
GLUCOSE-CAPILLARY: 171 mg/dL — AB (ref 70–99)
Glucose-Capillary: 246 mg/dL — ABNORMAL HIGH (ref 70–99)
Glucose-Capillary: 231 mg/dL — ABNORMAL HIGH (ref 70–99)
Glucose-Capillary: 152 mg/dL — ABNORMAL HIGH (ref 70–99)

## 2018-12-21 LAB — GLUCOSE, PLEURAL OR PERITONEAL FLUID: Glucose, Fluid: 220 mg/dL

## 2018-12-21 LAB — ALBUMIN, PLEURAL OR PERITONEAL FLUID: Albumin, Fluid: <1 g/dL

## 2018-12-21 LAB — GRAM STAIN

## 2018-12-21 MED ORDER — INSULIN GLARGINE 100 UNIT/ML ~~LOC~~ SOLN
5.0000 [IU] | Freq: Every day | SUBCUTANEOUS | Status: DC
  Administered 2018-12-21: 5 [IU] via SUBCUTANEOUS
  Filled 2018-12-21 (×2): qty 0.05

## 2018-12-21 MED ORDER — LIDOCAINE HCL 1 % IJ SOLN
INTRAMUSCULAR | Status: AC
  Filled 2018-12-21: qty 20

## 2018-12-21 NOTE — Progress Notes (Signed)
Inpatient Diabetes Program Recommendations  AACE/ADA: New Consensus Statement on Inpatient Glycemic Control (2015)  Target Ranges:  Prepandial:   less than 140 mg/dL      Peak postprandial:   less than 180 mg/dL (1-2 hours)      Critically ill patients:  140 - 180 mg/dL   Results for TERASA, ORSINI (MRN [id number]) as of 12/21/2018 07:42  Ref. Range 12/19/2018 07:38 12/19/2018 11:44 12/19/2018 16:19 12/19/2018 21:26  Glucose-Capillary Latest Ref Range: 70 - 99 mg/dL 207 (H)  3 units NOVOLOG  186 (H)  2 units NOVOLOG  256 (H)  5 units NOVOLOG  203 (H)   Results for BRIEONNA, CRUTCHER (MRN [id number]) as of 12/21/2018 07:42  Ref. Range 12/20/2018 07:48 12/20/2018 12:00 12/20/2018 16:11 12/20/2018 22:12  Glucose-Capillary Latest Ref Range: 70 - 99 mg/dL 202 (H)  3 units NOVOLOG  194 (H)  2 units NOVOLOG  207 (H)  3 units NOVOLOG  189 (H)   Results for VIKKIE, GOEDEN (MRN [id number]) as of 12/21/2018 07:42  Ref. Range 12/21/2018 04:40  Glucose Latest Ref Range: 70 - 99 mg/dL 196 (H)    Home DM Meds: NPH Insulin 45 units BID                             Humalog 8 units TID with meals  Current Orders: Novolog Sensitive Correction Scale/ SSI (0-9 units) TID AC     Now eating 25-75% of meals per documentation.  Takes NPH Insulin at home.  CBGs now running the in the 200s.     MD- Please consider starting a low dose of basal insulin for patient based on weight.    Can resume NPH for home at time of discharge.  Recommend Lantus 10 units QHS (0.15 units/kg dosing based on weight of 68 kg)     --Will follow patient during hospitalization--  Wyn Quaker RN, MSN, CDE Diabetes Coordinator Inpatient Glycemic Control Team Team Pager: [phone] (8a-5p)

## 2018-12-21 NOTE — Progress Notes (Signed)
Subjective: Pt resting comfortably in bed. No nasal bleeding.  Objective: Vital signs in last 24 hours: Temp:  [98 F (36.7 C)-99.2 F (37.3 C)] 98 F (36.7 C) (01/15 1315) Pulse Rate:  [72-76] 72 (01/15 1315) Resp:  [16] 16 (01/15 1315) BP: (107-155)/(58-92) 155/66 (01/15 1315) SpO2:  [92 %-97 %] 97 % (01/15 1315)  Physical Exam: ZNB:[id number].No acute distress Eyes: Herpupils are equal, round, reactive to light. Extraocular motion is intact.  Ears: Examination of the ears shows normal auricles and external auditory canals bilaterally.  Nose: Nasal examination showsa rhinorocket packing in the left nasal cavity. No acute bleeding at this time. Face: Facial examination shows no asymmetry. Palpation of the face elicit no significant tenderness.  Mouth: Oral cavity examination shows no mucosal lacerations. No significant trismus is noted.No blood in pharynx. Neck:Palpation of the neck reveals no lymphadenopathy or mass. The trachea is midline.  Neuro: Cranial nerves 2-12 are grossly intact.   Recent Labs    12/20/18 0540 12/21/18 0440  WBC 3.7* 4.2  HGB 9.3* 9.2*  HCT 29.2* 28.7*  PLT 92* 98*   Recent Labs    12/20/18 0540 12/21/18 0440  NA 142 143  K 4.8 4.7  CL 111 109  CO2 20* 23  GLUCOSE 210* 196*  BUN 38* 39*  CREATININE 2.43* 2.78*  CALCIUM 9.0 9.2    Medications:  I have reviewed the patient's current medications. Scheduled: . allopurinol  200 mg Oral Daily  . calcitRIOL  0.25 mcg Oral Daily  . carvedilol  3.125 mg Oral BID WC  . digoxin  0.125 mg Oral Daily  . doxycycline  100 mg Oral Q12H  . gabapentin  100 mg Oral BID  . insulin aspart  0-9 Units Subcutaneous TID WC  . insulin glargine  5 Units Subcutaneous QHS  . levothyroxine  75 mcg Oral QAC breakfast  . lidocaine      . loperamide  2 mg Oral QAC breakfast  . magnesium oxide  400 mg Oral Daily  . pantoprazole  40 mg Oral Q0600  . sodium bicarbonate  650 mg Oral BID    Continuous: . albumin human      Assessment/Plan: Left epistaxis, now control with left nasal packing. - On doxycycline. - Will leave the packing in place until Friday. - Will follow.   LOS: 5 days   Gussie Towson W Dasia Guerrier 12/21/2018, 8:05 PM

## 2018-12-21 NOTE — Progress Notes (Signed)
Subjective:  S/p paracentesis of 1.3 liters today - is pleasant and without complaints- some lower abdomen and back pain  Objective Vital signs in last 24 hours: Vitals:   12/21/18 0550 12/21/18 1009 12/21/18 1023 12/21/18 1315  BP: (!) 140/59 (!) 107/92 (!) 152/59 (!) 155/66  Pulse: 76   72  Resp: 16   16  Temp: 99.1 F (37.3 C)   98 F (36.7 C)  TempSrc: Oral   Oral  SpO2: 93%   97%  Weight:      Height:       Weight change:   Intake/Output Summary (Last 24 hours) at 12/21/2018 1401 Last data filed at 12/21/2018 0950 Gross per 24 hour  Intake 420 ml  Output 1000 ml  Net -580 ml    Assessment/ Plan: Pt is a 73 y.o. yo female who was admitted on 12/14/2018 with new diagnosis of cirrhosis and c diff along with chronic diagnosis of multiple myeloma  Assessment/Plan: 1. Renal- appears to have had some CKD- crt in the mid to high ones in late 2018 thought due to DM, possibly myeloma- now with some A on CKD crt up from 2.2 to 2.78 in the last 48 hours but no indications for dialysis at present.  Seems like UOP is trending up- 1300 last 24 hours - U/A from 1/14 is bland- renal ultrasound just shows increased echogenicity that would go along with CKD.  Urine sodium is not indicative of hepatorenal syndrome.  Continue to avoid nephrotoxins- BP is not low 2. Anemia- has had a transfusion and iv iron this admit due to ABLA- nosebleed- possibly due to myeloma- onc to get involved- had velcade as OP  3. HTN/volume- is not that overloaded and hypertensive.  BP is not low and urine sodium is not indicative of hepatorenal so will stop midodrine.  I see no indication for lasix  4. Metabolic acidosis- cont sodium bicarb   Louis Meckel    Labs: Basic Metabolic Panel: Recent Labs  Lab 12/18/18 0415  12/19/18 0423 12/20/18 0540 12/21/18 0440  NA 138  --  141 142 143  K 5.4*   < > 4.8 4.8 4.7  CL 111  --  112* 111 109  CO2 21*  --  20* 20* 23  GLUCOSE 152*  --  199* 210* 196*  BUN  33*  --  33* 38* 39*  CREATININE 2.10*  --  2.22* 2.43* 2.78*  CALCIUM 8.5*  --  9.0 9.0 9.2  PHOS 2.8  --   --   --  3.5   < > = values in this interval not displayed.   Liver Function Tests: Recent Labs  Lab 12/19/18 0423 12/20/18 0540 12/21/18 0440  AST 173* 175* 185*  ALT _0 ALKPHOS 286* 261* 262*  BILITOT 0.9 1.4* 1.7*  PROT 7.8 7.0 7.0  ALBUMIN 4.6 3.8 3.8  3.8   Recent Labs  Lab 12/14/18 2313  LIPASE 27   Recent Labs  Lab 12/20/18 1721 12/21/18 0440  AMMONIA 52* 51*   CBC: Recent Labs  Lab 12/14/18 2313  12/17/18 0459 12/18/18 0415 12/19/18 0423 12/19/18 1352 12/20/18 0540 12/21/18 0440  WBC 4.5   < > 2.7* 2.8* 2.8*  --  3.7* 4.2  NEUTROABS 3.3  --   --   --   --   --  2.1 2.8  HGB 10.1*   < > 8.0* 8.0* 7.6* 7.1* 9.3* 9.2*  HCT 31.9*   < >  25.4* 25.5* 24.2* 22.4* 29.2* 28.7*  MCV 96.7   < > 96.2 98.1 98.8  --  94.2 96.0  PLT 134*   < > 90* 96* 95*  --  92* 98*   < > = values in this interval not displayed.   Cardiac Enzymes: Recent Labs  Lab 12/19/18 0815 12/19/18 1352 12/19/18 1920  TROPONINI 0.04* 0.03* 0.03*   CBG: Recent Labs  Lab 12/20/18 1200 12/20/18 1611 12/20/18 2212 12/21/18 0803 12/21/18 1123  GLUCAP 194* 207* 189* 171* 246*    Iron Studies: No results for input(s): IRON, TIBC, TRANSFERRIN, FERRITIN in the last 72 hours. Studies/Results: US Paracentesis  Result Date: 12/21/2018 INDICATION: NASH cirrhosis with recurrent ascites. Request for diagnostic and therapeutic paracentesis. EXAM: ULTRASOUND GUIDED RIGHT LOWER QUADRANT PARACENTESIS MEDICATIONS: None. COMPLICATIONS: None immediate. PROCEDURE: Informed written consent was obtained from the patient after a discussion of the risks, benefits and alternatives to treatment. A timeout was performed prior to the initiation of the procedure. Initial ultrasound scanning demonstrates a small to moderate amount of ascites within the right lower abdominal quadrant. The right  lower abdomen was prepped and draped in the usual sterile fashion. 1% lidocaine with epinephrine was used for local anesthesia. Following this, a 19 gauge, 7-cm, Yueh catheter was introduced. An ultrasound image was saved for documentation purposes. The paracentesis was performed. The catheter was removed and a dressing was applied. The patient tolerated the procedure well without immediate post procedural complication. FINDINGS: A total of approximately 1.3 L of clear amber fluid was removed. Samples were sent to the laboratory as requested by the clinical team. IMPRESSION: Successful ultrasound-guided paracentesis yielding 1.3 liters of peritoneal fluid. Read by: Ascencion Dike PA-C Electronically Signed   By: Markus Daft M.D.   On: 12/21/2018 10:33   Medications: Infusions: . albumin human      Scheduled Medications: . allopurinol  200 mg Oral Daily  . calcitRIOL  0.25 mcg Oral Daily  . carvedilol  3.125 mg Oral BID WC  . digoxin  0.125 mg Oral Daily  . doxycycline  100 mg Oral Q12H  . gabapentin  100 mg Oral BID  . insulin aspart  0-9 Units Subcutaneous TID WC  . levothyroxine  75 mcg Oral QAC breakfast  . lidocaine      . loperamide  2 mg Oral QAC breakfast  . magnesium oxide  400 mg Oral Daily  . midodrine  5 mg Oral TID WC  . pantoprazole  40 mg Oral Q0600  . sodium bicarbonate  650 mg Oral BID    have reviewed scheduled and prn medications.  Physical Exam: General: very pleasant, NAD Heart: RRR Lungs: mostly clear Abdomen: seems soft Extremities: no periph edema    12/21/2018,2:01 PM  LOS: 5 days

## 2018-12-21 NOTE — Progress Notes (Addendum)
PROGRESS NOTE    Laura Sampson  RFF:[id number] DOB: [birth year] DOA: 12/14/2018 PCP: System, Pcp Not In   Brief Narrative:  Per Dr. Elspeth Sampson Laura Sampson 73 y.o.femalewith medical history significant ofDM2, HTN, CAD s/p CABG, PPM. Recent diagnosis of MM.  Patient recently hospitalized in OSH at end of December for abd pain, swelling, diarrhea. Work up during hospital stay showed cirrhotic appearance of liver, ascites, though not enough ascites apparently to tap when they tried. Stool came back positive for C.Diff. Patient was started on PO vanc, bumex, and aldactone.  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  Also saw Laura Sampson 2.3cm right lobe hepatic mass on [place] 12/21, though the same mass couldn't be visualized on repeat US x5 days later on the 26th.   ED Course:ALK 454, Creat 2.0 AST 210 ALT 27 (about what they had been running at OSH).  CT abd/pelvis shows:Moderate to large volume of asciteswith soft tissue anasarca.   Assessment & Plan:   Principal Problem:   Clostridium difficile diarrhea Active Problems:   Liver mass   Cirrhosis (HCC)   Ascites   DM2 (diabetes mellitus, type 2) (HCC)   HTN (hypertension)   PAF (paroxysmal atrial fibrillation) (HCC)   CKD (chronic kidney disease) stage 4, GFR 15-29 ml/min (HCC)   Multiple myeloma not having achieved remission (HCC)   Epistaxis   ARF (acute renal failure) (HCC)   Chest tightness   Acute blood loss anemia   Chest pain   1 Diarrhea  Recent diagnosis of C. difficile colitis at outside hospital C diff repeated here and negative GI path panel negative Per discussion of prior MD with pharmacy, pt has had full 14 day course of C diff Oral vanc has been d/c'd Bumex and spironolactone on hold.  Imodium as needed. Follow.  #2 Epistaxis Started 1/13 AM EDP placed nasal tampon on 1/13 to L nare Afrin nasal spray and neosynephrine nasal drops ordered S/p tranexamic acid to L nare ENT c/s,  appreciate recs - holding eliquis, gram + coverage while packing in place, plan for packing x 5 days Hold eliquis, continue doxycycline and nasal tampon   3.  Chest pain Seems resolved today Troponins flat Was given IV lasix and improved clinically (due to crackles on exam) Echo with EF 65-70%, incoordinate septal motion, severe TR (see report) EKG pending from 1/13, will order repeat  4.  Cirrhosis with ascites  abdominal pain Paracentesis on 1/9 with 3.2 L hazy fluid Repeat done 1/15 with 1.3 L fluid Cell count not consistent with SBP 1/9 cx NG Repeat gram stain/cx from 1/15 pending (no organisms on gram stain) Patient initially had some clinical improvement after initial paracentesis, seems slightly improved again today UA bland with small Hb, negative protein Bumex and spironolactone on hold due to probable dehydration from multiple episodes of diarrhea and slight bump in creatinine.   Acute hepatitis panel is negative.   Patient status post IV albumin x2 days.   Echo as noted above, EF 65-70%, incoordinate septal motion, severe TR (see report)  Ammonia ordered and elevated, but on discussion with GI today, does not seem confused - holding lactulose for now per GI recs for concern for diarrhea Concern for melanotic stool (may be related to swallowing blood, epistaxis, continue to monitor) GI recommending holding off on repeat paracentesis Cirrhosis w/u pending per GI (ANA, AMA, ASMA pending)  # Back Pain: pt c/o some back pain today that seems msk, continue to monitor  5.  Diabetes mellitus type 2  Hypoglycemia Patient noted to have Laura Sampson hypoglycemic episode with Laura Sampson CBG of 34 on 12/16/2018, which improved after orange juice and packets of sugar.   Hemoglobin A1c 8.7.   Will restart lantus at 5 units daily today and see how she does with this SSI Continue to monitor Appreciate diabetes coordinator recs  6.  Chronic kidney disease stage IV Baseline creatinine ~1.5 12/25, but  has fluctuated Creatinine rising to 2.78 today Urine bland as noted above, renal US without hydro Renal following, appreciate recs - recommending holding midodrine, continue to follow Possibly related to volume depletion with diarrhea, ABLA, diuretics? Continue to hold diuretics of Bumex and spironolactone.   Continue calcitriol.   Patient given some IV Lasix yesterday due to complaints of chest tightness with clinical improvement.  Nephrology following and patient initially started on oral Lasix daily which is going to be held due to concerns for some volume depletion.  Nephrology following and appreciate input and recommendations.   7.??  Liver mass Initial concern for 2.3 cm right lobe mass concerning for neoplasm seen on right upper quadrant ultrasound 11/26/2018.  Repeat ultrasound 5 days later could not see [name] mass.   Recommended repeat US in 6 months (unable for MRI given pacemaker presence - would recommend looking into MR compatibility of pacemaker) GI c/s, following, this will need to be followed outpatient  8.  Multiple myeloma Outpatient follow-up with oncology. She was supposed to see Dr. Irene Limbo today, will f/u with oncology to ensure follow up appointment  9.  Paroxysmal atrial fibrillation Continue Coreg and digoxin.   Eliquis currently on hold due to epistaxis  Will continue to monitor, discuss with ENT for possible resumption when safe  10.  Hypertension Slightly elevated today, continue to monitor, fluctuating  11.  Hyperkalemia Kayexalate x1.  Repeat potassium with resolution of hyperkalemia.  Follow.  12.  Acute blood loss anemia Secondary to significant epistaxis.  Up as low as 7.1.  Status post 2 units packed red blood cells hemoglobin currently at 9.3.  Follow.   DVT prophylaxis: SCD Code Status: full code Family Communication: husband and daughter at bedside Disposition Plan: pending s/o by nephrology and GI   Consultants:    GI  ENT  Nephrology  Procedures:  Paracentesis 1/15 Paracentesis 1/9 2 units pRBC 1/13  Echo Study Conclusions  - Procedure narrative: Transthoracic echocardiography. Image   quality was poor. The study was technically difficult, as Satish Hammers   result of poor acoustic windows. - Left ventricle: The cavity size was normal. There was moderate   concentric hypertrophy. Systolic function was vigorous. The   estimated ejection fraction was in the range of 65% to 70%.   Incoordinate septal motion. The study is not technically   sufficient to allow evaluation of LV diastolic function. - Mitral valve: Mildly thickened leaflets . There was moderate   regurgitation. - Left atrium: The atrium was normal in size. - Right ventricle: Mildly dilated, normal systolic function. Coarse   trabeulation at the apex. Pacer wire or catheter noted in right   ventricle - apperas to extend to the free wall. - Right atrium: The atrium was mildly dilated. Pacer wire or   catheter noted in right atrium. - Tricuspid valve: There was severe regurgitation. - Pulmonary arteries: PA peak pressure: 54 mm Hg (S). - Inferior vena cava: The vessel was normal in size. The   respirophasic diameter changes were in the normal range (>= 50%),   consistent with  normal central venous pressure.  Impressions:  - Technically difficult study. LVEF 65-70%, moderate LVH,   incoordinate septal motion, moderate MR, normal LA size, severe   TR, wire or catheter seen in the right heart extending toward the   RV free wall, mildly dilated RV with normal systolic function,   severe TR, RVSP 54 mmHg, normal IVC.  Antimicrobials:  Anti-infectives (From admission, onward)   Start     Dose/Rate Route Frequency Ordered Stop   01/20/19 1000  vancomycin (VANCOCIN) 50 mg/mL oral solution 125 mg  Status:  Discontinued     125 mg Oral Every 3 DAYS 12/15/18 0235 12/15/18 1504   01/12/19 1000  vancomycin (VANCOCIN) 50 mg/mL oral solution  125 mg  Status:  Discontinued     125 mg Oral Every other day 12/15/18 0235 12/15/18 1504   01/05/19 1000  vancomycin (VANCOCIN) 50 mg/mL oral solution 125 mg  Status:  Discontinued     125 mg Oral Daily 12/15/18 0235 12/15/18 1504   12/29/18 1000  vancomycin (VANCOCIN) 50 mg/mL oral solution 125 mg  Status:  Discontinued     125 mg Oral 2 times daily 12/15/18 0235 12/15/18 1504   12/19/18 2200  doxycycline (VIBRA-TABS) tablet 100 mg     100 mg Oral Every 12 hours 12/19/18 2138     12/15/18 1000  vancomycin (VANCOCIN) 50 mg/mL oral solution 125 mg  Status:  Discontinued     125 mg Oral 4 times daily 12/15/18 0235 12/15/18 1504        Subjective: Feeling ok today.  Just Julieanne Hadsall lot going on in general.  Objective: Vitals:   12/21/18 0550 12/21/18 1009 12/21/18 1023 12/21/18 1315  BP: (!) 140/59 (!) 107/92 (!) 152/59 (!) 155/66  Pulse: 76   72  Resp: 16   16  Temp: 99.1 F (37.3 C)   98 F (36.7 C)  TempSrc: Oral   Oral  SpO2: 93%   97%  Weight:      Height:        Intake/Output Summary (Last 24 hours) at 12/21/2018 1647 Last data filed at 12/21/2018 0950 Gross per 24 hour  Intake 420 ml  Output 1000 ml  Net -580 ml   Filed Weights   12/15/18 0321  Weight: 68 kg    Examination:  General exam: Appears calm and comfortable  HEENT: nasal tampon in place to L nare Respiratory system: Clear to auscultation. Respiratory effort normal. Cardiovascular system: S1 & S2 heard, RRR.  Gastrointestinal system: Abdomen is distended, soft and nontender. Central nervous system: Alert and oriented. No focal neurological deficits. Extremities: Symmetric 5 x 5 power. Skin: No rashes, lesions or ulcers Psychiatry: Judgement and insight appear normal. Mood & affect appropriate.     Data Reviewed: I have personally reviewed following labs and imaging studies  CBC: Recent Labs  Lab 12/14/18 2313  12/17/18 0459 12/18/18 0415 12/19/18 0423 12/19/18 1352 12/20/18 0540 12/21/18 0440   WBC 4.5   < > 2.7* 2.8* 2.8*  --  3.7* 4.2  NEUTROABS 3.3  --   --   --   --   --  2.1 2.8  HGB 10.1*   < > 8.0* 8.0* 7.6* 7.1* 9.3* 9.2*  HCT 31.9*   < > 25.4* 25.5* 24.2* 22.4* 29.2* 28.7*  MCV 96.7   < > 96.2 98.1 98.8  --  94.2 96.0  PLT 134*   < > 90* 96* 95*  --  92* 98*   < > =  values in this interval not displayed.   Basic Metabolic Panel: Recent Labs  Lab 12/16/18 0147 12/17/18 0459 12/18/18 0415 12/18/18 1344 12/19/18 0423 12/20/18 0540 12/21/18 0440  NA 139 138 138  --  141 142 143  K 4.3 4.5 5.4* 4.7 4.8 4.8 4.7  CL 110 109 111  --  112* 111 109  CO2 22 22 21*  --  20* 20* 23  GLUCOSE 106* 179* 152*  --  199* 210* 196*  BUN 43* 36* 33*  --  33* 38* 39*  CREATININE 2.56* 2.27* 2.10*  --  2.22* 2.43* 2.78*  CALCIUM 8.5* 8.4* 8.5*  --  9.0 9.0 9.2  MG 2.1  --   --   --   --   --   --   PHOS  --   --  2.8  --   --   --  3.5   GFR: Estimated Creatinine Clearance: 16.5 mL/min (Jaiven Graveline) (by C-G formula based on SCr of 2.78 mg/dL (H)). Liver Function Tests: Recent Labs  Lab 12/16/18 0147 12/17/18 0459 12/18/18 0415 12/19/18 0423 12/20/18 0540 12/21/18 0440  AST 176* 160*  --  173* 175* 185*  ALT 21 18  --  14 16 16   ALKPHOS 359* 290*  --  286* 261* 262*  BILITOT 0.6 0.6  --  0.9 1.4* 1.7*  PROT 6.9 6.9  --  7.8 7.0 7.0  ALBUMIN 2.4* 3.6 3.9 4.6 3.8 3.8  3.8   Recent Labs  Lab 12/14/18 2313  LIPASE 27   Recent Labs  Lab 12/20/18 1721 12/21/18 0440  AMMONIA 52* 51*   Coagulation Profile: Recent Labs  Lab 12/19/18 0815  INR 1.41   Cardiac Enzymes: Recent Labs  Lab 12/19/18 0815 12/19/18 1352 12/19/18 1920  TROPONINI 0.04* 0.03* 0.03*   BNP (last 3 results) No results for input(s): PROBNP in the last 8760 hours. HbA1C: No results for input(s): HGBA1C in the last 72 hours. CBG: Recent Labs  Lab 12/20/18 1200 12/20/18 1611 12/20/18 2212 12/21/18 0803 12/21/18 1123  GLUCAP 194* 207* 189* 171* 246*   Lipid Profile: No results for  input(s): CHOL, HDL, LDLCALC, TRIG, CHOLHDL, LDLDIRECT in the last 72 hours. Thyroid Function Tests: No results for input(s): TSH, T4TOTAL, FREET4, T3FREE, THYROIDAB in the last 72 hours. Anemia Panel: No results for input(s): VITAMINB12, FOLATE, FERRITIN, TIBC, IRON, RETICCTPCT in the last 72 hours. Sepsis Labs: No results for input(s): PROCALCITON, LATICACIDVEN in the last 168 hours.  Recent Results (from the past 240 hour(s))  Gastrointestinal Panel by PCR , Stool     Status: None   Collection Time: 12/15/18 11:11 AM  Result Value Ref Range Status   Campylobacter species NOT DETECTED NOT DETECTED Final   Plesimonas shigelloides NOT DETECTED NOT DETECTED Final   Salmonella species NOT DETECTED NOT DETECTED Final   Yersinia enterocolitica NOT DETECTED NOT DETECTED Final   Vibrio species NOT DETECTED NOT DETECTED Final   Vibrio cholerae NOT DETECTED NOT DETECTED Final   Enteroaggregative E coli (EAEC) NOT DETECTED NOT DETECTED Final   Enteropathogenic E coli (EPEC) NOT DETECTED NOT DETECTED Final   Enterotoxigenic E coli (ETEC) NOT DETECTED NOT DETECTED Final   Shiga like toxin producing E coli (STEC) NOT DETECTED NOT DETECTED Final   Shigella/Enteroinvasive E coli (EIEC) NOT DETECTED NOT DETECTED Final   Cryptosporidium NOT DETECTED NOT DETECTED Final   Cyclospora cayetanensis NOT DETECTED NOT DETECTED Final   Entamoeba histolytica NOT DETECTED NOT DETECTED Final  Giardia lamblia NOT DETECTED NOT DETECTED Final   Adenovirus F40/41 NOT DETECTED NOT DETECTED Final   Astrovirus NOT DETECTED NOT DETECTED Final   Norovirus GI/GII NOT DETECTED NOT DETECTED Final   Rotavirus Ceceilia Cephus NOT DETECTED NOT DETECTED Final   Sapovirus (I, II, IV, and V) NOT DETECTED NOT DETECTED Final    Comment: Performed at [facility], [address] [place], [place] [address]  C difficile quick scan w PCR reflex     Status: None   Collection Time: 12/15/18 11:11 AM  Result Value Ref Range Status   C  Diff antigen NEGATIVE NEGATIVE Final   C Diff toxin NEGATIVE NEGATIVE Final   C Diff interpretation No C. difficile detected.  Final    Comment: Performed at [facility], [place] [address], [place], [place] [address]  Culture, body fluid-bottle     Status: None   Collection Time: 12/15/18  3:58 PM  Result Value Ref Range Status   Specimen Description FLUID PERITONEAL  Final   Special Requests BOTTLES DRAWN AEROBIC AND ANAEROBIC  Final   Culture   Final    NO GROWTH 5 DAYS Performed at [facility], [place] [address], [place], [place] [address]    Report Status 12/20/2018 FINAL  Final  Gram stain     Status: None   Collection Time: 12/15/18  3:58 PM  Result Value Ref Range Status   Specimen Description FLUID PERITONEAL  Final   Special Requests NONE  Final   Gram Stain   Final    CYTOSPIN SMEAR NO WBC SEEN NO ORGANISMS SEEN Performed at [facility], [place] [address] [place], [place] [address]    Report Status 12/16/2018 FINAL  Final  Culture, Urine     Status: Abnormal   Collection Time: 12/16/18  6:38 AM  Result Value Ref Range Status   Specimen Description URINE, CLEAN CATCH  Final   Special Requests   Final    NONE Performed at [place] [address] [place], [place] [address]    Culture (Sanjana Folz)  Final    80,000 COLONIES/mL MULTIPLE SPECIES PRESENT, SUGGEST RECOLLECTION   Report Status 12/17/2018 FINAL  Final  Gram stain     Status: None   Collection Time: 12/21/18 10:49 AM  Result Value Ref Range Status   Specimen Description PERITONEAL  Final   Special Requests NONE  Final   Gram Stain   Final    RARE WBC PRESENT, PREDOMINANTLY MONONUCLEAR NO ORGANISMS SEEN Performed at [facility], [place] [address], [place], [place] [address]    Report Status 12/21/2018 FINAL  Final         Radiology Studies: US Paracentesis  Result Date: 12/21/2018 INDICATION: NASH cirrhosis with recurrent ascites. Request for  diagnostic and therapeutic paracentesis. EXAM: ULTRASOUND GUIDED RIGHT LOWER QUADRANT PARACENTESIS MEDICATIONS: None. COMPLICATIONS: None immediate. PROCEDURE: Informed written consent was obtained from the patient after Gram Siedlecki discussion of the risks, benefits and alternatives to treatment. Valkyrie Guardiola timeout was performed prior to the initiation of the procedure. Initial ultrasound scanning demonstrates Takeria Marquina small to moderate amount of ascites within the right lower abdominal quadrant. The right lower abdomen was prepped and draped in the usual sterile fashion. 1% lidocaine with epinephrine was used for local anesthesia. Following this, Kerwin Augustus 19 gauge, 7-cm, Yueh catheter was introduced. An ultrasound image was saved for documentation purposes. The paracentesis was performed. The catheter was removed and Joshawa Dubin dressing was applied. The patient  tolerated the procedure well without immediate post procedural complication. FINDINGS: Ohanna Gassert total of approximately 1.3 L of clear amber fluid was removed. Samples were sent to the laboratory as requested by the clinical team. IMPRESSION: Successful ultrasound-guided paracentesis yielding 1.3 liters of peritoneal fluid. Read by: Ascencion Dike PA-C Electronically Signed   By: Markus Daft M.D.   On: 12/21/2018 10:33        Scheduled Meds: . allopurinol  200 mg Oral Daily  . calcitRIOL  0.25 mcg Oral Daily  . carvedilol  3.125 mg Oral BID WC  . digoxin  0.125 mg Oral Daily  . doxycycline  100 mg Oral Q12H  . gabapentin  100 mg Oral BID  . insulin aspart  0-9 Units Subcutaneous TID WC  . levothyroxine  75 mcg Oral QAC breakfast  . lidocaine      . loperamide  2 mg Oral QAC breakfast  . magnesium oxide  400 mg Oral Daily  . pantoprazole  40 mg Oral Q0600  . sodium bicarbonate  650 mg Oral BID   Continuous Infusions: . albumin human       LOS: 5 days    Time spent: over 30 min    Fayrene Helper, MD [facility] Pager Please refer to [facility]  If 7PM-7AM, please contact  night-coverage [email or web address] Password [employer] 12/21/2018, 4:47 PM

## 2018-12-21 NOTE — Progress Notes (Addendum)
[facility] Progress Note  CC:  Cirrhosis, diarrhea  Subjective:  Had another paracentesis today with 1.3 Liters removed.  Again negative for SBP.  SAAG, etc suggestive of portal HTN.  Autoimmune liver labs negative.  Complaining of lower abdominal pain radiating around to her back, 7/10, since paracentesis.  Was started on scheduled Imodium yesterday by our service but then also started on lactulose by the hospitalist so still having diarrhea.  Hgb stable this AM.  Objective:  Vital signs in last 24 hours: Temp:  [98.2 F (36.8 C)-99.2 F (37.3 C)] 99.1 F (37.3 C) (01/15 0550) Pulse Rate:  [57-76] 76 (01/15 0550) Resp:  [16] 16 (01/15 0550) BP: (130-140)/(58-59) 140/59 (01/15 0550) SpO2:  [92 %-95 %] 93 % (01/15 0550) Last BM Date: 12/19/18 General:  Alert, Well-developed, in NAD Heart:  Regular rate and rhythm; no murmurs Pulm:  CTAB.  No increased WOB. Abdomen:  Soft, non-distended.  BS present.  Diffuse TTP. Extremities:  Without edema. Neurologic:  Alert and oriented x 4;  grossly normal neurologically. Psych:  Alert and cooperative. Normal mood and affect.   Lab Results: Recent Labs    12/19/18 0423 12/19/18 1352 12/20/18 0540 12/21/18 0440  WBC 2.8*  --  3.7* 4.2  HGB 7.6* 7.1* 9.3* 9.2*  HCT 24.2* 22.4* 29.2* 28.7*  PLT 95*  --  92* 98*   BMET Recent Labs    12/19/18 0423 12/20/18 0540 12/21/18 0440  NA 141 142 143  K 4.8 4.8 4.7  CL 112* 111 109  CO2 20* 20* 23  GLUCOSE 199* 210* 196*  BUN 33* 38* 39*  CREATININE 2.22* 2.43* 2.78*  CALCIUM 9.0 9.0 9.2   LFT Recent Labs    12/21/18 0440  PROT 7.0  ALBUMIN 3.8  3.8  AST 185*  ALT 16  ALKPHOS 262*  BILITOT 1.7*  BILIDIR 0.4*  IBILI 1.3*   PT/INR Recent Labs    12/19/18 0815  LABPROT 17.1*  INR 1.41   Assessment / Plan: 73 y.o. female with cirrhosis, ascites, MM, renal insufficiency.  Epistaxis has stopped with balloon packing.  Blood thinner currently held.  ENT  following.  Hb very good bump from 7 to 9 after two units blood transfusion and still holding stable this AM.  Switched to once daily PO PPI on 1/14.  Cr up a bit again from yesterday. Nephrology input noted, they doubt hepatorenal syndrome.  Lasix 49m orally once daily started, but then held again by renal on 1/14.  Her fluid management is proving to be quite difficult, greatly appreciate nephrology input.  Ascites tapped again today without sign of SBP.  SAAG suggestive of portal HTN.  Current MELD-Na is 20.  Workup of her cirrhosis etiology ongoing: ANA, AMA, ASMA all negative. Viral testing was all negative.  She has chronically loose stools, worse during a C. Diff infection recently. Early this admission C. Diff stool testing was negative. She sill has intermittently loose stools.  Started on imodium (rather than PRN) on 1/14.  She and her husband tell me she had a normal colonoscopy about a year ago (done for routine screening I believe).  We will try to get those records here for review because she wants to establish care locally.  She was also started on lactulose late last evening for elevated ammonia level at 52, but patient does not seem confused at all, mentating well.  This is causing her a lot of diarrhea again.  I am going  to hold this.  She also had an EGD about a year ago, not sure why but she tells me it was normal.  We will try to get those records here for review as well.  Continue low sodium diet.  Will discuss with hospitalist and see if they are willing to have oncology see her while she is here since she missed her outpatient appt today.    LOS: 5 days   Laban Emperor. Zehr  12/21/2018, 10:01 AM  ________________________________________________________________________  [facility] MD note:  I  reviewed the data and agree with the assessment and plan described above.  Biggest issue seems to be gradually rising Cr.  Greatly appreciate renal input.  Would not recommend  repeat paracentesis unless ascites is quite high volume (only removed 1.3L today) due to volume shifts, renal compromise.   Owens Loffler, MD [facility] Pager [phone]

## 2018-12-21 NOTE — Progress Notes (Signed)
PT Cancellation Note  Patient Details Name: SHALENE GALLEN MRN: [id number] DOB: [birth year]   Cancelled Treatment:     Paracentesis today.  Will attempt to see another day as schedule permits.   Rica Koyanagi  PTA Acute  Rehabilitation Services Pager      [phone] Office      [phone]

## 2018-12-21 NOTE — Telephone Encounter (Addendum)
"  [name] daughter Laura Sampson [phone]) calling to notify Dr. Irene Limbo she will not be in today.  Hospitalized [facility] since last Wednesday.  What do we need to do to get oncology involved in her care?  I brought her to [facility] because there's more family here.  What do we need to do to find out what stage of Multiple Myeloma she is in?   She's having nose bleeds, has gone five weeks without oncology care.  Had been hospitalized in [facility] in [place], [place], [place]. before Christmas after reaction to chemotherapy and radiation.  No one's discussed discharge."  Advised to call new patient scheduler upon discharge to reschedule.

## 2018-12-21 NOTE — Procedures (Signed)
PROCEDURE SUMMARY:  Successful US guided paracentesis from RLQ.  Yielded 1.3 L of clear amber fluid.  No immediate complications.  Pt tolerated well.   Specimen was sent for labs.  EBL < 59m  KAscencion DikePA-C 12/21/2018 10:34 AM

## 2018-12-22 ENCOUNTER — Telehealth: Payer: Self-pay | Admitting: Hematology

## 2018-12-22 ENCOUNTER — Other Ambulatory Visit: Payer: Self-pay

## 2018-12-22 LAB — HEPATIC FUNCTION PANEL
ALT: 15 U/L (ref 0–44)
AST: 184 U/L — AB (ref 15–41)
Albumin: 3.5 g/dL (ref 3.5–5.0)
Alkaline Phosphatase: 253 U/L — ABNORMAL HIGH (ref 38–126)
Bilirubin, Direct: 0.4 mg/dL — ABNORMAL HIGH (ref 0.0–0.2)
Indirect Bilirubin: 0.7 mg/dL (ref 0.3–0.9)
Total Bilirubin: 1.1 mg/dL (ref 0.3–1.2)
Total Protein: 7.1 g/dL (ref 6.5–8.1)

## 2018-12-22 LAB — MAGNESIUM: Magnesium: 2 mg/dL (ref 1.7–2.4)

## 2018-12-22 LAB — CBC
HCT: 29.2 % — ABNORMAL LOW (ref 36.0–46.0)
Hemoglobin: 9.1 g/dL — ABNORMAL LOW (ref 12.0–15.0)
MCH: 30.3 pg (ref 26.0–34.0)
MCHC: 31.2 g/dL (ref 30.0–36.0)
MCV: 97.3 fL (ref 80.0–100.0)
Platelets: 103 10*3/uL — ABNORMAL LOW (ref 150–400)
RBC: 3 MIL/uL — ABNORMAL LOW (ref 3.87–5.11)
RDW: 17.5 % — ABNORMAL HIGH (ref 11.5–15.5)
WBC: 3.4 10*3/uL — ABNORMAL LOW (ref 4.0–10.5)
nRBC: 0 % (ref 0.0–0.2)

## 2018-12-22 LAB — RENAL FUNCTION PANEL
Albumin: 3.6 g/dL (ref 3.5–5.0)
Anion gap: 11 (ref 5–15)
BUN: 38 mg/dL — ABNORMAL HIGH (ref 8–23)
CHLORIDE: 106 mmol/L (ref 98–111)
CO2: 22 mmol/L (ref 22–32)
Calcium: 8.9 mg/dL (ref 8.9–10.3)
Creatinine, Ser: 2.51 mg/dL — ABNORMAL HIGH (ref 0.44–1.00)
GFR calc Af Amer: 21 mL/min — ABNORMAL LOW (ref >60)
GFR calc non Af Amer: 18 mL/min — ABNORMAL LOW (ref >60)
GLUCOSE: 171 mg/dL — AB (ref 70–99)
Phosphorus: 3.4 mg/dL (ref 2.5–4.6)
Potassium: 4.4 mmol/L (ref 3.5–5.1)
Sodium: 139 mmol/L (ref 135–145)

## 2018-12-22 LAB — GLUCOSE, CAPILLARY
GLUCOSE-CAPILLARY: 160 mg/dL — AB (ref 70–99)
Glucose-Capillary: 155 mg/dL — ABNORMAL HIGH (ref 70–99)
Glucose-Capillary: 250 mg/dL — ABNORMAL HIGH (ref 70–99)

## 2018-12-22 MED ORDER — INSULIN NPH (HUMAN) (ISOPHANE) 100 UNIT/ML ~~LOC~~ SUSP: 5.0000 [IU] | Freq: Two times a day (BID) | SUBCUTANEOUS | 0 refills | Status: DC

## 2018-12-22 MED ORDER — SACCHAROMYCES BOULARDII 250 MG PO CAPS: 250.0000 mg | ORAL_CAPSULE | Freq: Two times a day (BID) | ORAL | 0 refills | Status: AC

## 2018-12-22 MED ORDER — LOPERAMIDE HCL 2 MG PO TABS: 2.0000 mg | ORAL_TABLET | Freq: Four times a day (QID) | ORAL | 0 refills | Status: AC | PRN

## 2018-12-22 MED ORDER — INSULIN NPH (HUMAN) (ISOPHANE) 100 UNIT/ML ~~LOC~~ SUSP: 5.0000 [IU] | Freq: Two times a day (BID) | SUBCUTANEOUS | 0 refills | Status: AC

## 2018-12-22 MED ORDER — DOXYCYCLINE HYCLATE 100 MG PO TABS: 100.0000 mg | ORAL_TABLET | Freq: Two times a day (BID) | ORAL | 0 refills | Status: DC

## 2018-12-22 MED ORDER — LOPERAMIDE HCL 2 MG PO TABS: 2.0000 mg | ORAL_TABLET | Freq: Four times a day (QID) | ORAL | 0 refills | Status: DC | PRN

## 2018-12-22 MED ORDER — DOXYCYCLINE HYCLATE 100 MG PO TABS: 100.0000 mg | ORAL_TABLET | Freq: Two times a day (BID) | ORAL | 0 refills | Status: AC

## 2018-12-22 MED ORDER — SACCHAROMYCES BOULARDII 250 MG PO CAPS: 250.0000 mg | ORAL_CAPSULE | Freq: Two times a day (BID) | ORAL | 0 refills | Status: DC

## 2018-12-22 MED ORDER — SACCHAROMYCES BOULARDII 250 MG PO CAPS: 250.0000 mg | ORAL_CAPSULE | Freq: Two times a day (BID) | ORAL | Status: DC

## 2018-12-22 NOTE — Progress Notes (Signed)
Subjective:  Hemodynamically stable overnight - only 175 of UOP recorded - crt stable to slightly improved - she is c/o upper abd pain and cont diarrhea  Objective Vital signs in last 24 hours: Vitals:   12/21/18 1023 12/21/18 1315 12/21/18 2038 12/22/18 0543  BP: (!) 152/59 (!) 155/66 (!) 133/57 (!) 141/63  Pulse:  72 61 77  Resp:  16 16 16   Temp:  98 F (36.7 C) 99 F (37.2 C) 98.7 F (37.1 C)  TempSrc:  Oral Oral Oral  SpO2:  97% 97% 94%  Weight:      Height:       Weight change:   Intake/Output Summary (Last 24 hours) at 12/22/2018 1303 Last data filed at 12/22/2018 1009 Gross per 24 hour  Intake 1200 ml  Output 175 ml  Net 1025 ml    Assessment/ Plan: Pt is a 73 y.o. yo female who was admitted on 12/14/2018 with new diagnosis of cirrhosis and c diff along with chronic diagnosis of multiple myeloma  Assessment/Plan: 1. Renal- appears to have had some CKD- crt in the mid to high ones in late 2018 thought due to DM, possibly myeloma- now with some A on CKD crt up from 2.2 to 2.78, 2.51 today-  no indications for dialysis at present.  Seemed like UOP was trending up but not well recorded- - U/A from 1/14 is bland- renal ultrasound just shows increased echogenicity that would go along with CKD.  Urine sodium is not indicative of hepatorenal syndrome.  Continue to avoid nephrotoxins- BP is not low.  Crt pretty stable- no change in plan for now.  New crt baseline may be worse than previous  2. Anemia- has had a transfusion and iv iron this admit due to ABLA- nosebleed- possibly due to myeloma- onc to get involved- had velcade as OP in past - hgb stable last 24 hours  3. HTN/volume- is not that overloaded and slightly hypertensive.  BP is not low and urine sodium is not indicative of hepatorenal so will stop midodrine.  I see no indication for lasix  4. Metabolic acidosis- cont sodium bicarb - co2 improving  5. Dispo- if is ready to go otherwise I have not objection to d/c especially  since crt trended down- will arrange follow up with [facility]: Basic Metabolic Panel: Recent Labs  Lab 12/18/18 0415  12/20/18 0540 12/21/18 0440 12/22/18 0440  NA 138   < > 142 143 139  K 5.4*   < > 4.8 4.7 4.4  CL 111   < > 111 109 106  CO2 21*   < > 20* 23 22  GLUCOSE 152*   < > 210* 196* 171*  BUN 33*   < > 38* 39* 38*  CREATININE 2.10*   < > 2.43* 2.78* 2.51*  CALCIUM 8.5*   < > 9.0 9.2 8.9  PHOS 2.8  --   --  3.5 3.4   < > = values in this interval not displayed.   Liver Function Tests: Recent Labs  Lab 12/20/18 0540 12/21/18 0440 12/22/18 0440  AST 175* 185* 184*  ALT 16 16 15   ALKPHOS 261* 262* 253*  BILITOT 1.4* 1.7* 1.1  PROT 7.0 7.0 7.1  ALBUMIN 3.8 3.8  3.8 3.5  3.6   No results for input(s): LIPASE, AMYLASE in the last 168 hours. Recent Labs  Lab 12/20/18 1721 12/21/18 0440  AMMONIA 52* 51*   CBC: Recent  Labs  Lab 12/18/18 0415 12/19/18 0423  12/20/18 0540 12/21/18 0440 12/22/18 0440  WBC 2.8* 2.8*  --  3.7* 4.2 3.4*  NEUTROABS  --   --   --  2.1 2.8  --   HGB 8.0* 7.6*   < > 9.3* 9.2* 9.1*  HCT 25.5* 24.2*   < > 29.2* 28.7* 29.2*  MCV 98.1 98.8  --  94.2 96.0 97.3  PLT 96* 95*  --  92* 98* 103*   < > = values in this interval not displayed.   Cardiac Enzymes: Recent Labs  Lab 12/19/18 0815 12/19/18 1352 12/19/18 1920  TROPONINI 0.04* 0.03* 0.03*   CBG: Recent Labs  Lab 12/21/18 1123 12/21/18 1700 12/21/18 2155 12/22/18 0723 12/22/18 1201  GLUCAP 246* 231* 152* 155* 160*    Iron Studies: No results for input(s): IRON, TIBC, TRANSFERRIN, FERRITIN in the last 72 hours. Studies/Results: US Paracentesis  Result Date: 12/21/2018 INDICATION: NASH cirrhosis with recurrent ascites. Request for diagnostic and therapeutic paracentesis. EXAM: ULTRASOUND GUIDED RIGHT LOWER QUADRANT PARACENTESIS MEDICATIONS: None. COMPLICATIONS: None immediate. PROCEDURE: Informed written consent was obtained from the  patient after a discussion of the risks, benefits and alternatives to treatment. A timeout was performed prior to the initiation of the procedure. Initial ultrasound scanning demonstrates a small to moderate amount of ascites within the right lower abdominal quadrant. The right lower abdomen was prepped and draped in the usual sterile fashion. 1% lidocaine with epinephrine was used for local anesthesia. Following this, a 19 gauge, 7-cm, Yueh catheter was introduced. An ultrasound image was saved for documentation purposes. The paracentesis was performed. The catheter was removed and a dressing was applied. The patient tolerated the procedure well without immediate post procedural complication. FINDINGS: A total of approximately 1.3 L of clear amber fluid was removed. Samples were sent to the laboratory as requested by the clinical team. IMPRESSION: Successful ultrasound-guided paracentesis yielding 1.3 liters of peritoneal fluid. Read by: Ascencion Dike PA-C Electronically Signed   By: Markus Daft M.D.   On: 12/21/2018 10:33   Medications: Infusions: . albumin human      Scheduled Medications: . allopurinol  200 mg Oral Daily  . calcitRIOL  0.25 mcg Oral Daily  . carvedilol  3.125 mg Oral BID WC  . digoxin  0.125 mg Oral Daily  . doxycycline  100 mg Oral Q12H  . gabapentin  100 mg Oral BID  . insulin aspart  0-9 Units Subcutaneous TID WC  . insulin glargine  5 Units Subcutaneous QHS  . levothyroxine  75 mcg Oral QAC breakfast  . loperamide  2 mg Oral QAC breakfast  . magnesium oxide  400 mg Oral Daily  . pantoprazole  40 mg Oral Q0600  . saccharomyces boulardii  250 mg Oral BID  . sodium bicarbonate  650 mg Oral BID    have reviewed scheduled and prn medications.  Physical Exam: General: very pleasant, NAD Heart: RRR Lungs: mostly clear Abdomen: seems soft Extremities: no periph edema    12/22/2018,1:03 PM  LOS: 6 days

## 2018-12-22 NOTE — Progress Notes (Signed)
Physical Therapy Treatment Patient Details Name: Laura Sampson MRN: [id number] DOB: February 09, 1946 Today's Date: 12/22/2018    History of Present Illness 73 year old female history of type 2 diabetes, hypertension, coronary artery disease status post CABG, status post PPM, recent diagnosis of multiple myeloma, chronic kidney disease and admitted for Clostridium difficile diarrhea and pt also with ascites and R lobe hepatic mass.  S/p paracentesis 12/15/2018    PT Comments    Pt feeling better and hoped to "go home today".  Assisted OOB to amb to bathroom.  Assisted with amb.  General Gait Details: amb without an AD as prior pt did not use.  Mild unsteady gait with <25% need to steady self on furniture/wall rail.  Pt c/o of ABD "pulling" when upright.  "Feels like my insides are dropping".  Otherwise, pt tolerated well.    Follow Up Recommendations  Home health PT;Supervision/Assistance - 24 hour     Equipment Recommendations  Rolling walker with 5" wheels(if she agrees)    Recommendations for Other Services       Precautions / Restrictions Precautions Precautions: Fall Restrictions Weight Bearing Restrictions: No    Mobility  Bed Mobility Overal bed mobility: Needs Assistance Bed Mobility: Supine to Sit;Sit to Supine     Supine to sit: Supervision Sit to supine: Supervision   General bed mobility comments: pt self able with rail  Transfers Overall transfer level: Needs assistance Equipment used: None Transfers: Sit to/from [employer] Transfers Sit to Stand: Supervision;Min guard Stand pivot transfers: Supervision;Min guard       General transfer comment: did not use walker this session for trial as pt did not use prior to admit.  Assisted with sit to stand from bed and toilet transfer.  Pt present with good safety cognition and use of hands to steady self.    Ambulation/Gait Ambulation/Gait assistance: Min guard Gait Distance (Feet): 125 Feet Assistive device:  None;1 person hand held assist Gait Pattern/deviations: Step-through pattern;Decreased stride length Gait velocity: WNL   General Gait Details: amb without an AD as prior pt did not use.  Mild unsteady gait with <25% need to steady self on furniture/wall rail.  Pt c/o of ABD "pulling" when upright.  "Feels like my insides are dropping".  Otherwise, pt tolerated well.     Stairs             Wheelchair Mobility    Modified Rankin (Stroke Patients Only)       Balance                                            Cognition Arousal/Alertness: Awake/alert Behavior During Therapy: WFL for tasks assessed/performed Overall Cognitive Status: Within Functional Limits for tasks assessed                                 General Comments: pleasant      Exercises      General Comments        Pertinent Vitals/Pain Pain Assessment: Faces Faces Pain Scale: Hurts a little bit Pain Location: abdomen Pain Descriptors / Indicators: Tightness Pain Intervention(s): Monitored during session    Home Living                      Prior Function  PT Goals (current goals can now be found in the care plan section)      Frequency    Min 3X/week      PT Plan Current plan remains appropriate    Co-evaluation              AM-PAC PT "6 Clicks" Mobility   Outcome Measure  Help needed turning from your back to your side while in a flat bed without using bedrails?: None Help needed moving from lying on your back to sitting on the side of a flat bed without using bedrails?: None Help needed moving to and from a bed to a chair (including a wheelchair)?: None Help needed standing up from a chair using your arms (e.g., wheelchair or bedside chair)?: None Help needed to walk in hospital room?: A Little Help needed climbing 3-5 steps with a railing? : A Little 6 Click Score: 22    End of Session Equipment Utilized During  Treatment: Gait belt Activity Tolerance: Patient tolerated treatment well Patient left: in bed;with call bell/phone within reach;with bed alarm set   PT Visit Diagnosis: Other abnormalities of gait and mobility (R26.89)     Time: 6767-2094 PT Time Calculation (min) (ACUTE ONLY): 13 min  Charges:  $Gait Training: 8-22 mins                     Rica Koyanagi  PTA Acute  Rehabilitation Services Pager      [phone] Office      [phone]

## 2018-12-22 NOTE — Telephone Encounter (Signed)
I spoke to Mrs. Pla daughter, Clemmie Krill, about rescheduling her mother's appt. I asked Ms. Mare Ferrari to call me back once she knows when her mom will be discharged from the hospital.

## 2018-12-22 NOTE — Discharge Summary (Signed)
Physician Discharge Summary  Laura Sampson VOZ:[id number] DOB: September 06, 1946 DOA: 12/14/2018  PCP: System, Pcp Not In  Admit date: 12/14/2018 Discharge date: 12/22/2018  Time spent: 45 minutes  Recommendations for Outpatient Follow-up:  1. Follow up outpatient CBC/CMP 2. Follow up renal function/volume status as outpatient, currently holding diuretics, resume per renal/PCP as outpatient as indicated by volume/renal function 3. Ensure follow up with GI as outpatient for cirrhosis 4. Ensure follow up with Dr. Benjamine Mola for epistaxis - recommending holding eliquis until at least Monday (1/20) 5. Follow up with PCP Monday to discuss resumption of eliquis  6. Follow up with renal as outpatient for assistance with diuretics 7. Follow up blood sugars, pt needed reduced dose of insulin here, discharged on 5 NPH BID 8. Concern for liver mass as noted below, will need close follow up, see below (consider looking into whether her pacemaker is MRI compatible?)  9. Follow repeat CXR outpatient  Discharge Diagnoses:  Principal Problem:   Clostridium difficile diarrhea Active Problems:   Liver mass   Cirrhosis (HCC)   Ascites   DM2 (diabetes mellitus, type 2) (HCC)   HTN (hypertension)   PAF (paroxysmal atrial fibrillation) (HCC)   CKD (chronic kidney disease) stage 4, GFR 15-29 ml/min (HCC)   Multiple myeloma not having achieved remission (HCC)   Epistaxis   ARF (acute renal failure) (HCC)   Chest tightness   Acute blood loss anemia   Chest pain   Discharge Condition: stable  Diet recommendation: low sodium  Filed Weights   12/15/18 0321  Weight: 68 kg    History of present illness:  Per Dr. Elspeth Cho [name] 73 y.o.femalewith medical history significant ofDM2, HTN, CAD s/p CABG, PPM. Recent diagnosis of MM.  Patient recently hospitalized in OSH at end of December for abd pain, swelling, diarrhea. Work up during hospital stay showed cirrhotic appearance of liver, ascites,  though not enough ascites apparently to tap when they tried. Stool came back positive for C.Diff. Patient was started on PO vanc, bumex, and aldactone.  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  Also saw [name] 2.3cm right lobe hepatic mass on [place] 12/21, though the same mass couldn't be visualized on repeat US x5 days later on the 26th.  ED Course:ALK 454, Creat 2.0 AST 210 ALT 27 (about what they had been running at OSH).  CT abd/pelvis shows:Moderate to large volume of asciteswith soft tissue anasarca.  Brief summary below She was admitted for diarrhea and concern for c diff.  Her c diff study was negative.  She was noted to have ascites and had paracentesis x2.  Her renal function worsened here and renal followed.  Her diuretics are on hold.  She had an episode of epistaxis requiring nasal tampon.  Her eliquis is on hold and she's planning to follow up with ENT as an outpatient.  She'll need continued outpatient follow up.  See below for additional details  Hospital Course:  1 Diarrhea  Recent diagnosis of C. difficile colitis at outside hospital C diff repeated here and negative GI path panel negative Per discussion of prior MD with pharmacy, pt has had full 14 day course of C diff Oral vanc has been d/c'd Bumex and spironolactone on hold. Imodium as needed. Follow.  #2 Epistaxis Started 1/13 AM EDP placed nasal tampon on 1/13 to L nare Afrin nasal spray and neosynephrine nasal drops ordered S/p tranexamic acid to L nare ENT c/s, appreciate recs - holding eliquis, gram +  coverage while packing in place, plan for packing x 5 days - discussed with Dr. Benjamine Mola today, she'll follow up with ENT 1/17 for removal.  He's recommending holding eliquis until Monday. Hold eliquis, continue doxycycline and nasal tampon   3. Chest pain Seems resolved today Troponins flat Was given IV lasix and improved clinically (due to crackles on exam) Echo with EF 65-70%, incoordinate  septal motion, severe TR (see report) EKG appears similar to prior Follow up with cardiology outpatient  4. Cirrhosis with ascites  abdominal pain Paracentesis on 1/9 with 3.2 L hazy fluid Repeat done 1/15 with 1.3 L fluid Cell count not consistent with SBP 1/9 cx NG Repeat gram stain/cx from 1/15 pending (no organisms on gram stain) Patient initially had some clinical improvement after initial paracentesis, seems slightly improved again today UA bland with small Hb, negative protein Bumex and spironolactone on hold due to renal function  Acute hepatitis panel is negative.  Patient status post IV albumin x2days. Echo as noted above, EF 65-70%, incoordinate septal motion, severe TR (see report)  Ammonia ordered and elevated, but on discussion with GI today, does not seem confused - holding lactulose for now per GI recs for concern for diarrhea Concern for melanotic stool (may be related to swallowing blood, epistaxis, continue to monitor) GI recommending holding off on repeat paracentesis Cirrhosis w/u pending per GI (ANA, AMA, ASMA negative), suspecting nash? Follow up with GI as outpatient regarding cirrhosis Will need outpatient follow up for diuertics   # Back Pain: pt c/o some back pain today that seems msk, continue to monitor  5. Diabetes mellitus type 2  Hypoglycemia Patient noted to have [name] with [name] of 34 on 12/16/2018, which improved after orange juice and packets of sugar.  Hemoglobin A1c 8.7.  She's only needing minimal insulin, will decrease her to NPH 5 units BID and she'll need close follow up outpatient   6. AKI on Chronic kidney disease stage IV Baseline creatinine ~1.5 12/25, but has fluctuated Creatinine improved to 2.51 today Urine bland as noted above, renal US without hydro Renal following, appreciate recs - recommending holding midodrine, continue to follow - holding diuretics at this point, will need follow up outpatient  regarding diuretics in setting of ascites above Possibly related to volume depletion with diarrhea, ABLA, diuretics? Continue to hold diuretics of Bumex and spironolactone.  Continue calcitriol.   7.?? Liver mass Initial concern for 2.3 cm right lobe mass concerning for neoplasm seen on right upper quadrant ultrasound 11/26/2018. Repeat ultrasound 5 days later could not see Talik Casique mass.  Recommended repeat US in 6 months (unable for MRI given pacemaker presence - would recommend looking into MR compatibility of pacemaker) GI c/s, following, this will need to be followed outpatient  8. Multiple myeloma Outpatient follow-up with oncology. She'll need to follow up with Dr. Irene Limbo as outpatient, this appointment will need to be rescheduled Sent message to Dr. Irene Limbo  9. Paroxysmal atrial fibrillation Continue Coreg and digoxin.  Eliquis currently on hold due to epistaxis  - plan for resumption Monday if stable Will continue to monitor, discuss with ENT for possible resumption when safe  10. Hypertension Slightly elevated today, continue to monitor, fluctuating  11. Hyperkalemia improved  12. Acute blood loss anemia Stable, in setting of epistaxis, s/p transfusion  Procedures: Paracentesis 1/15 Paracentesis 1/9 2 units pRBC 1/13   Consultations:  GI  ENT  Nephrology  IR  Discharge Exam: Vitals:   12/21/18 2038 12/22/18 0543  BP: (!) 133/57 (!) 141/63  Pulse: 61 77  Resp: 16 16  Temp: 99 F (37.2 C) 98.7 F (37.1 C)  SpO2: 97% 94%   Looking forward to discharge home. Feeling ok, still some diarrhea.  General: No acute distress. L nare with nasal tampon Cardiovascular: Heart sounds show Eshaal Duby regular rate, and rhythm.  Lungs: Clear to auscultation bilaterally  Abdomen: Soft, nontender, nondistended Neurological: Alert and oriented 3. Moves all extremities 4. Cranial nerves II through XII grossly intact. Skin: Warm and dry. No rashes or  lesions. Extremities: No clubbing or cyanosis. No edema.  Psychiatric: Mood and affect are normal. Insight and judgment are appropriate.  Discharge Instructions   Discharge Instructions    Call MD for:  difficulty breathing, headache or visual disturbances   Complete by:  As directed    Call MD for:  extreme fatigue   Complete by:  As directed    Call MD for:  hives   Complete by:  As directed    Call MD for:  persistant dizziness or light-headedness   Complete by:  As directed    Call MD for:  persistant nausea and vomiting   Complete by:  As directed    Call MD for:  redness, tenderness, or signs of infection (pain, swelling, redness, odor or green/yellow discharge around incision site)   Complete by:  As directed    Call MD for:  severe uncontrolled pain   Complete by:  As directed    Call MD for:  temperature >100.4   Complete by:  As directed    Diet - low sodium heart healthy   Complete by:  As directed    Diet 2 gram sodium   Complete by:  As directed    Discharge instructions   Complete by:  As directed    You were seen for concern for diarrhea and c diff.  The c diff study here was negative.  We've started you on Zabria Liss probiotic.  Take your imodium daily to help slow down your diarrhea (watch for developing constipation).   You were also seen for cirrhosis and ascites.  We're stopping additional diuretics for now until you follow up with renal (or PCP) to restart this.  They'll need to follow your renal function closely.   Dr. Benjamine Mola is going to follow up with you in his ENT clinic in [facility] tomorrow at 1 PM to remove your nasal tampon.  Please continue doxycycline until this is removed.   Hold your eliquis at least until Monday, 1/20.  After this, you can resume your eliquis, please follow up with your PCP for repeat labs before restarting the eliquis.  Follow up with gastroenterology as an outpatient for your cirrhosis.   Your insulin requirement seems to have  decreased.  Decrease your NPH insulin to 5 units twice daily and follow your blood sugars closely.  Show Kaven Cumbie blood sugar log to your PCP on Monday.  There was concern for Terril Chestnut liver mass at an outside hospital, please follow up repeat imaging with your PCP (or gastroenterology).  Please follow up with cardiology as an outpatient.   Please follow up with Dr. Irene Limbo as scheduled as an outpatient.  Return for new, recurrent, or worsening symptoms.  Please ask your PCP to request records from this hospitalization so they know what was done and what the next steps will be.   Increase activity slowly   Complete by:  As directed    Increase activity slowly  Complete by:  As directed      Allergies as of 12/22/2018      Reactions   Penicillins Rash   Has patient had Archer Vise PCN reaction causing immediate rash, facial/tongue/throat swelling, SOB or lightheadedness with hypotension: Yes Has patient had Abygail Galeno PCN reaction causing severe rash involving mucus membranes or skin necrosis: Yes Has patient had Riyan Haile PCN reaction that required hospitalization: No Has patient had Margaret Staggs PCN reaction occurring within the last 10 years: Unknown If all of the above answers are "NO", then may proceed with Cephalosporin use.   Clindamycin/lincomycin Itching      Medication List    STOP taking these medications   acetaminophen 500 MG tablet Commonly known as:  TYLENOL   bumetanide 0.5 MG tablet Commonly known as:  BUMEX   ELIQUIS 5 MG Tabs tablet Generic drug:  apixaban   insulin lispro 100 UNIT/ML injection Commonly known as:  HUMALOG   magnesium oxide 400 MG tablet Commonly known as:  MAG-OX   spironolactone 25 MG tablet Commonly known as:  ALDACTONE     TAKE these medications   allopurinol 100 MG tablet Commonly known as:  ZYLOPRIM Take 200 mg by mouth daily.   calcitRIOL 0.25 MCG capsule Commonly known as:  ROCALTROL Take 0.25 mcg by mouth daily.   carvedilol 3.125 MG tablet Commonly known as:   COREG Take 3.125 mg by mouth 2 (two) times daily with Fabienne Nolasco meal.   digoxin 0.125 MG tablet Commonly known as:  LANOXIN Take 0.125 mg by mouth daily.   doxycycline 100 MG tablet Commonly known as:  VIBRA-TABS Take 1 tablet (100 mg total) by mouth every 12 (twelve) hours for 2 days. (discontinue after nasal tampon is removed)   gabapentin 100 MG capsule Commonly known as:  NEURONTIN Take 100 mg by mouth 2 (two) times daily.   hydrALAZINE 25 MG tablet Commonly known as:  APRESOLINE Take 25 mg by mouth 3 (three) times daily.   insulin NPH Human 100 UNIT/ML injection Commonly known as:  HUMULIN N,NOVOLIN N Inject 0.05 mLs (5 Units total) into the skin 2 (two) times daily before Mir Fullilove meal for 30 days. What changed:    how much to take  when to take this   levothyroxine 75 MCG tablet Commonly known as:  SYNTHROID, LEVOTHROID Take 75 mcg by mouth daily before breakfast.   loperamide 2 MG tablet Commonly known as:  IMODIUM Katena Petitjean-D Take 1 tablet (2 mg total) by mouth 4 (four) times daily as needed for diarrhea or loose stools.   saccharomyces boulardii 250 MG capsule Commonly known as:  FLORASTOR Take 1 capsule (250 mg total) by mouth 2 (two) times daily for 30 days.   sodium bicarbonate 650 MG tablet Take 650 mg by mouth 2 (two) times daily.            Durable Medical Equipment  (From admission, onward)         Start     Ordered   12/17/18 1314  For home use only DME 4 wheeled rolling walker with seat  Once    Question:  Patient needs Sierra Bissonette walker to treat with the following condition  Answer:  Debility   12/17/18 1314         Allergies  Allergen Reactions  . Penicillins Rash    Has patient had Lakeria Starkman PCN reaction causing immediate rash, facial/tongue/throat swelling, SOB or lightheadedness with hypotension: Yes Has patient had Roshana Shuffield PCN reaction causing severe rash involving mucus membranes or  skin necrosis: Yes Has patient had Tumeka Chimenti PCN reaction that required hospitalization: No Has  patient had Jung Ingerson PCN reaction occurring within the last 10 years: Unknown If all of the above answers are "NO", then may proceed with Cephalosporin use.   . Clindamycin/Lincomycin Itching   Follow-up Information    [facility] Follow up.   Specialty:  [place] Why:  [facility] nursing/physical/occupational therapy/aide,social worker Contact information: [address] [place] [address] [phone]        Libby Maw, MD. Go on 12/26/2018.   Specialty:  Family Medicine Why:  @ 1:15p Contact information: [place] [address] [phone]        Brunetta Genera, MD Follow up.   Specialties:  Hematology, Oncology Why:  Call to reschedule appointment Contact information: [place] [address] [phone]        Milus Banister, MD Follow up.   Specialty:  Gastroenterology Why:  Call for follow up appointment Contact information: [address] [address] [phone]        Corliss Parish, MD Follow up.   Specialty:  Nephrology Why:  Call for follow up appointment Contact information: [place] [address] [phone]            The results of significant diagnostics from this hospitalization (including imaging, microbiology, ancillary and laboratory) are listed below for reference.    Significant Diagnostic Studies: US Renal  Result Date: 12/16/2018 CLINICAL DATA:  Acute renal failure. EXAM: RENAL / URINARY TRACT ULTRASOUND COMPLETE COMPARISON:  12/15/2018. FINDINGS: Right Kidney: Renal measurements: 10.6 x 4.1 x 5.1 cm = volume: 117.8 mL. Increased echogenicity. No mass or hydronephrosis visualized. Left Kidney: Renal measurements: 9.9 x 4.8 x 5.0 cm = volume: 125.2 mL. Small amount of perinephric fluid noted. Increased echogenicity. No mass or hydronephrosis visualized. Bladder: Appears normal for degree of bladder distention. Incidental  note is made of mild ascites. IMPRESSION: 1. Increased renal echogenicity noted bilaterally. Chronic medical renal disease could present this fashion. Small amount of left perinephric fluid noted. Active renal pathology including pyelonephritis can not be excluded. No acute renal abnormality otherwise noted. No hydronephrosis or bladder distention. 2.  Mild ascites. Electronically Signed   By: Marcello Moores  Register   On: 12/16/2018 17:22   US Paracentesis  Result Date: 12/21/2018 INDICATION: NASH cirrhosis with recurrent ascites. Request for diagnostic and therapeutic paracentesis. EXAM: ULTRASOUND GUIDED RIGHT LOWER QUADRANT PARACENTESIS MEDICATIONS: None. COMPLICATIONS: None immediate. PROCEDURE: Informed written consent was obtained from the patient after Roselynn Whitacre discussion of the risks, benefits and alternatives to treatment. Shakendra Griffeth timeout was performed prior to the initiation of the procedure. Initial ultrasound scanning demonstrates Raymone Pembroke small to moderate amount of ascites within the right lower abdominal quadrant. The right lower abdomen was prepped and draped in the usual sterile fashion. 1% lidocaine with epinephrine was used for local anesthesia. Following this, Lounette Sloan 19 gauge, 7-cm, Yueh catheter was introduced. An ultrasound image was saved for documentation purposes. The paracentesis was performed. The catheter was removed and Shabrea Weldin dressing was applied. The patient tolerated the procedure well without immediate post procedural complication. FINDINGS: Tiane Szydlowski total of approximately 1.3 L of clear amber fluid was removed. Samples were sent to the laboratory as requested by the clinical team. IMPRESSION: Successful ultrasound-guided paracentesis yielding 1.3 liters of peritoneal fluid. Read by: Ascencion Dike PA-C Electronically Signed   By: Markus Daft M.D.   On: 12/21/2018 10:33  US Paracentesis  Result Date: 12/15/2018 INDICATION: Patient with history of multiple myeloma, cirrhosis, abdominal distension, and ascites. Request is  made for diagnostic and therapeutic paracentesis. EXAM: ULTRASOUND GUIDED DIAGNOSTIC AND THERAPEUTIC PARACENTESIS MEDICATIONS: 10 mL of 1% lidocaine COMPLICATIONS: None immediate. PROCEDURE: Informed written consent was obtained from the patient after Mulan Adan discussion of the risks, benefits and alternatives to treatment. Promise Weldin timeout was performed prior to the initiation of the procedure. Initial ultrasound scanning demonstrates Toshiye Kever moderate amount of ascites within the right lower abdominal quadrant. The right lower abdomen was prepped and draped in the usual sterile fashion. 1% lidocaine was used for local anesthesia. Following this, Aline Wesche 19 gauge, 7-cm, Yueh catheter was introduced. An ultrasound image was saved for documentation purposes. The paracentesis was performed. The catheter was removed and Lynell Greenhouse dressing was applied. The patient tolerated the procedure well without immediate post procedural complication. FINDINGS: Keyosha Tiedt total of approximately 3.2 L of hazy amber fluid was removed. Samples were sent to the laboratory as requested by the clinical team. IMPRESSION: Successful ultrasound-guided paracentesis yielding 3.2 L of peritoneal fluid. Read by: Earley Abide, PA-C Electronically Signed   By: Sandi Mariscal M.D.   On: 12/15/2018 15:48   Dg Chest Port 1 View  Result Date: 12/19/2018 CLINICAL DATA:  Chest tightness. EXAM: PORTABLE CHEST 1 VIEW COMPARISON:  11/30/2017 FINDINGS: Decreased lung volumes compared to the previous examination. Hazy densities in the left lower chest. Upper lungs are clear. Heart size remains enlarged with evidence of previous cardiac surgery. Stable appearance of the right-sided dual chamber cardiac pacemaker. No acute bone abnormality. IMPRESSION: Low lung volumes with hazy densities at the left lung base. Left basilar densities may be related to atelectasis. Subtle infection can not be excluded. Electronically Signed   By: Markus Daft M.D.   On: 12/19/2018 08:43   Ct Renal Stone  Study  Result Date: 12/15/2018 CLINICAL DATA:  Abdominal distention and pain for 2 weeks. EXAM: CT ABDOMEN AND PELVIS WITHOUT CONTRAST TECHNIQUE: Multidetector CT imaging of the abdomen and pelvis was performed following the standard protocol without IV contrast. COMPARISON:  Chest CT report 10/19/2000 FINDINGS: Lower chest: Cardiomegaly with right atrial and right ventricular leads identified. No pericardial effusion or thickening. Mild bronchiectasis to the lower lobes with left lower lobe atelectasis. No effusion or pneumothorax. Hepatobiliary: Cirrhotic appearing liver without definite space-occupying mass given limitations of Katrinka Herbison noncontrast study. Tiny granuloma in the right hepatic lobe. Gallbladder is physiologically distended and contains layering gallstones. No mural thickening is identified. Pancreas: Normal without ductal dilatation Spleen: Normal size spleen without mass. Adrenals/Urinary Tract: Normal bilateral adrenal glands and kidneys without nephrolithiasis nor hydroureteronephrosis. The urinary bladder is unremarkable. Stomach/Bowel: Stomach is within normal limits. Appendix is not confidently identified. No evidence of bowel wall thickening, distention, or inflammatory changes. Vascular/Lymphatic: Moderate aortoiliac atherosclerosis. Lymphadenopathy. Reproductive: Uterus appears surgically absent. No adnexal mass is seen. Other: Moderate to large volume of ascites with soft tissue anasarca. Musculoskeletal: No acute nor suspicious osseous abnormalities. Mild anterior height loss of L4 without retropulsion, likely chronic. Facet arthrosis is noted of the lumbar spine. IMPRESSION: 1. Moderate to large volume of ascites with soft tissue anasarca. 2. Cirrhotic appearing liver without definite space-occupying mass given limitations of Keslee Harrington noncontrast study. 3. Uncomplicated cholelithiasis. 4. Aortoiliac atherosclerosis. Electronically Signed   By: Ashley Royalty M.D.   On: 12/15/2018 00:22     Microbiology: Recent Results (from the past 240 hour(s))  Gastrointestinal Panel by PCR , Stool     Status:  None   Collection Time: 12/15/18 11:11 AM  Result Value Ref Range Status   Campylobacter species NOT DETECTED NOT DETECTED Final   Plesimonas shigelloides NOT DETECTED NOT DETECTED Final   Salmonella species NOT DETECTED NOT DETECTED Final   Yersinia enterocolitica NOT DETECTED NOT DETECTED Final   Vibrio species NOT DETECTED NOT DETECTED Final   Vibrio cholerae NOT DETECTED NOT DETECTED Final   Enteroaggregative E coli (EAEC) NOT DETECTED NOT DETECTED Final   Enteropathogenic E coli (EPEC) NOT DETECTED NOT DETECTED Final   Enterotoxigenic E coli (ETEC) NOT DETECTED NOT DETECTED Final   Shiga like toxin producing E coli (STEC) NOT DETECTED NOT DETECTED Final   Shigella/Enteroinvasive E coli (EIEC) NOT DETECTED NOT DETECTED Final   Cryptosporidium NOT DETECTED NOT DETECTED Final   Cyclospora cayetanensis NOT DETECTED NOT DETECTED Final   Entamoeba histolytica NOT DETECTED NOT DETECTED Final   Giardia lamblia NOT DETECTED NOT DETECTED Final   Adenovirus F40/41 NOT DETECTED NOT DETECTED Final   Astrovirus NOT DETECTED NOT DETECTED Final   Norovirus GI/GII NOT DETECTED NOT DETECTED Final   Rotavirus Bairon Klemann NOT DETECTED NOT DETECTED Final   Sapovirus (I, II, IV, and V) NOT DETECTED NOT DETECTED Final    Comment: Performed at [facility], [address] [place], [place] [address]  C difficile quick scan w PCR reflex     Status: None   Collection Time: 12/15/18 11:11 AM  Result Value Ref Range Status   C Diff antigen NEGATIVE NEGATIVE Final   C Diff toxin NEGATIVE NEGATIVE Final   C Diff interpretation No C. difficile detected.  Final    Comment: Performed at [facility], [place] [address], [place], [place] [address]  Culture, body fluid-bottle     Status: None   Collection Time: 12/15/18  3:58 PM  Result Value Ref Range Status   Specimen Description  FLUID PERITONEAL  Final   Special Requests BOTTLES DRAWN AEROBIC AND ANAEROBIC  Final   Culture   Final    NO GROWTH 5 DAYS Performed at [facility], [place] [address] [place], [place] [address]    Report Status 12/20/2018 FINAL  Final  Gram stain     Status: None   Collection Time: 12/15/18  3:58 PM  Result Value Ref Range Status   Specimen Description FLUID PERITONEAL  Final   Special Requests NONE  Final   Gram Stain   Final    CYTOSPIN SMEAR NO WBC SEEN NO ORGANISMS SEEN Performed at [facility], [place] [address] [place], [place] [address]    Report Status 12/16/2018 FINAL  Final  Culture, Urine     Status: Abnormal   Collection Time: 12/16/18  6:38 AM  Result Value Ref Range Status   Specimen Description URINE, CLEAN CATCH  Final   Special Requests   Final    NONE Performed at [facility] [address], [place],  [address]    Culture (Marley Pakula)  Final    80,000 COLONIES/mL MULTIPLE SPECIES PRESENT, SUGGEST RECOLLECTION   Report Status 12/17/2018 FINAL  Final  Gram stain     Status: None   Collection Time: 12/21/18 10:49 AM  Result Value Ref Range Status   Specimen Description PERITONEAL  Final   Special Requests NONE  Final   Gram Stain   Final    RARE WBC PRESENT, PREDOMINANTLY MONONUCLEAR NO ORGANISMS SEEN Performed at [facility], [place] [address], [place], [place]  [address]    Report Status 12/21/2018 FINAL  Final  Culture, body fluid-bottle     Status: None (Preliminary result)   Collection Time: 12/21/18 10:49 AM  Result Value Ref Range Status   Specimen Description PERITONEAL  Final   Special Requests NONE  Final   Culture   Final    NO GROWTH < 24 HOURS Performed at [facility], [place] [address] [place], [place] [address]    Report Status PENDING  Incomplete     Labs: Basic Metabolic Panel: Recent Labs  Lab 12/16/18 0147  12/18/18 0415 12/18/18 1344 12/19/18 0423 12/20/18 0540 12/21/18 0440  12/22/18 0440  NA 139   < > 138  --  141 142 143 139  K 4.3   < > 5.4* 4.7 4.8 4.8 4.7 4.4  CL 110   < > 111  --  112* 111 109 106  CO2 22   < > 21*  --  20* 20* 23 22  GLUCOSE 106*   < > 152*  --  199* 210* 196* 171*  BUN 43*   < > 33*  --  33* 38* 39* 38*  CREATININE 2.56*   < > 2.10*  --  2.22* 2.43* 2.78* 2.51*  CALCIUM 8.5*   < > 8.5*  --  9.0 9.0 9.2 8.9  MG 2.1  --   --   --   --   --   --  2.0  PHOS  --   --  2.8  --   --   --  3.5 3.4   < > = values in this interval not displayed.   Liver Function Tests: Recent Labs  Lab 12/17/18 0459 12/18/18 0415 12/19/18 0423 12/20/18 0540 12/21/18 0440 12/22/18 0440  AST 160*  --  173* 175* 185* 184*  ALT 18  --  14 16 16 15   ALKPHOS 290*  --  286* 261* 262* 253*  BILITOT 0.6  --  0.9 1.4* 1.7* 1.1  PROT 6.9  --  7.8 7.0 7.0 7.1  ALBUMIN 3.6 3.9 4.6 3.8 3.8  3.8 3.5  3.6   No results for input(s): LIPASE, AMYLASE in the last 168 hours. Recent Labs  Lab 12/20/18 1721 12/21/18 0440  AMMONIA 52* 51*   CBC: Recent Labs  Lab 12/18/18 0415 12/19/18 0423 12/19/18 1352 12/20/18 0540 12/21/18 0440 12/22/18 0440  WBC 2.8* 2.8*  --  3.7* 4.2 3.4*  NEUTROABS  --   --   --  2.1 2.8  --   HGB 8.0* 7.6* 7.1* 9.3* 9.2* 9.1*  HCT 25.5* 24.2* 22.4* 29.2* 28.7* 29.2*  MCV 98.1 98.8  --  94.2 96.0 97.3  PLT 96* 95*  --  92* 98* 103*   Cardiac Enzymes: Recent Labs  Lab 12/19/18 0815 12/19/18 1352 12/19/18 1920  TROPONINI 0.04* 0.03* 0.03*   BNP: BNP (last 3 results) No results for input(s): BNP in the last 8760 hours.  ProBNP (last 3 results) No results for input(s): PROBNP in the last 8760 hours.  CBG: Recent Labs  Lab 12/21/18 1123 12/21/18 1700 12/21/18 2155 12/22/18 0723 12/22/18 1201  GLUCAP 246* 231* 152* 155* 160*       Signed:  Fayrene Helper MD.  [facility] 12/22/2018, 4:54 PM

## 2018-12-22 NOTE — Progress Notes (Addendum)
[facility] Progress Note  CC:  Cirrhosis, diarrhea  Subjective:  Feeling better today.  Abdominal pain is better.  Still reports diarrhea.  Had 3 diarrhea BM's since waking up this AM.  Objective:  Vital signs in last 24 hours: Temp:  [98 F (36.7 C)-99 F (37.2 C)] 98.7 F (37.1 C) (01/16 0543) Pulse Rate:  [61-77] 77 (01/16 0543) Resp:  [16] 16 (01/16 0543) BP: (107-155)/(57-92) 141/63 (01/16 0543) SpO2:  [94 %-97 %] 94 % (01/16 0543) Last BM Date: 12/19/18 General:  Alert, Well-developed, in NAD Heart:  Regular rate and rhythm; no murmurs Pulm:  CTAB.  No increased WOB. Abdomen:  Soft, non-distended.  BS present.  Minimal diffuse TTP but more in upper abdomen. Extremities:  Without edema. Neurologic:  Alert and oriented x 4;  grossly normal neurologically. Psych:  Alert and cooperative. Normal mood and affect.  Intake/Output from previous day: 01/15 0701 - 01/16 0700 In: 960 [P.O.:960] Out: 175 [Urine:175]  Lab Results: Recent Labs    12/20/18 0540 12/21/18 0440 12/22/18 0440  WBC 3.7* 4.2 3.4*  HGB 9.3* 9.2* 9.1*  HCT 29.2* 28.7* 29.2*  PLT 92* 98* 103*   BMET Recent Labs    12/20/18 0540 12/21/18 0440 12/22/18 0440  NA 142 143 139  K 4.8 4.7 4.4  CL 111 109 106  CO2 20* 23 22  GLUCOSE 210* 196* 171*  BUN 38* 39* 38*  CREATININE 2.43* 2.78* 2.51*  CALCIUM 9.0 9.2 8.9   LFT Recent Labs    12/22/18 0440  PROT 7.1  ALBUMIN 3.5  3.6  AST 184*  ALT 15  ALKPHOS 253*  BILITOT 1.1  BILIDIR 0.4*  IBILI 0.7   US Paracentesis  Result Date: 12/21/2018 INDICATION: NASH cirrhosis with recurrent ascites. Request for diagnostic and therapeutic paracentesis. EXAM: ULTRASOUND GUIDED RIGHT LOWER QUADRANT PARACENTESIS MEDICATIONS: None. COMPLICATIONS: None immediate. PROCEDURE: Informed written consent was obtained from the patient after a discussion of the risks, benefits and alternatives to treatment. A timeout was performed prior to the  initiation of the procedure. Initial ultrasound scanning demonstrates a small to moderate amount of ascites within the right lower abdominal quadrant. The right lower abdomen was prepped and draped in the usual sterile fashion. 1% lidocaine with epinephrine was used for local anesthesia. Following this, a 19 gauge, 7-cm, Yueh catheter was introduced. An ultrasound image was saved for documentation purposes. The paracentesis was performed. The catheter was removed and a dressing was applied. The patient tolerated the procedure well without immediate post procedural complication. FINDINGS: A total of approximately 1.3 L of clear amber fluid was removed. Samples were sent to the laboratory as requested by the clinical team. IMPRESSION: Successful ultrasound-guided paracentesis yielding 1.3 liters of peritoneal fluid. Read by: Ascencion Dike PA-C Electronically Signed   By: Markus Daft M.D.   On: 12/21/2018 10:33   Assessment / Plan: 73 y.o.femalewith cirrhosis, ascites, MM, renal insufficiency.  Epistaxis has stopped withballoonpacking. Blood thinner currently held. ENT following. Hb very good bump from 7 to 9 after two units blood transfusion and still holding stable this AM.  Switched to once daily PO PPI on 1/14.  Cr down slightly today. Nephrology input noted, they doubt hepatorenal syndrome. Lasix 64m orally once daily started, but then held again by renal on 1/14. Her fluid management is proving to be quite difficult, greatly appreciate nephrology input. Ascites tapped again 1/15 without sign of SBP.  SAAG suggestive of portal HTN.  Current MELD-Na is  20.Workup of her cirrhosis etiology ongoing: ANA, AMA, ASMA all negative. Viral testing was all negative.  Suspect NASH at this point?  She has chronically loose stools, worse during a C. Diff infection recently. Early this admission C. Diff stool testing was negative. She sill has intermittently loose stools.  Started on imodium (rather  than PRN) on 1/14. She and her husband tell me she had a normal colonoscopy about a year ago (done for routine screening I believe). We will try to get those records here for review because she wants to establish care locally.  She was also started on lactulose late 1/14 for elevated ammonia level at 52, but patient does not seem confused at all, mentating well.  This was causing her a lot of diarrhea again so was discontinued again on 1/15.  Will start florastor BID.  She is on magnesium oxide 400 mg daily as well so this could be contributing to the diarrhea also.  ? If she needs to continue this and how long.  She also had an EGD about a year ago, not sure why but she tells me it was normal. We will try to get those records here for review as well.  Continue low sodium diet.   LOS: 6 days   Laban Emperor. Zehr  12/22/2018, 9:16 AM    ________________________________________________________________________  Velora Heckler GI MD note:  I personally examined the patient, reviewed the data and agree with the assessment and plan described above.  She is not sure why she is taking magnesium, it could certainly contribute to her chronic loose stools and I am going to stop it.  She will continue once daily imodium on a scheduled basis as well.  Her Cr improved a bit. Hopefully that trend will continue.  She has ascites and I assume that will build up again since she's is not on any diuretics. Renal input in how to control her ascites is very appreciated (she seems very sensitive to diuretics).  Low salt diet indefinitely.    Owens Loffler, MD [facility] Pager [phone]

## 2018-12-22 NOTE — Care Management Note (Signed)
Case Management Note  Patient Details  Name: Laura Sampson MRN: [id number] Date of Birth: 24-Apr-1946  Subjective/Objective:Patient was ordered for a rw-patient already has a rw less 73yr old. [facility]. Provided w/pcp appt(see d/c section)  Dtr voiced understanding.                  Action/Plan:d/c home w/HHC.   Expected Discharge Date:                  Expected Discharge Plan:  [facility] Referral:     Discharge planning Services  CM Consult  Post Acute Care Choice:  Home Health(Active w/[facility] HHRN/HHPT) Choice offered to:  Patient  DME Arranged:    DME Agency:     HH Arranged:  RN, PT, OT, Nurse's Aide, Social Work [employer]ency:     Status of Service:  Completed, signed off  If discussed at [facility] dates discussed:    Additional Comments:  MDessa Phi RN 12/22/2018, 9:30 AM

## 2018-12-26 ENCOUNTER — Encounter: Payer: Self-pay | Admitting: Family Medicine

## 2018-12-26 ENCOUNTER — Ambulatory Visit (INDEPENDENT_AMBULATORY_CARE_PROVIDER_SITE_OTHER): Payer: Medicare Other | Admitting: Family Medicine

## 2018-12-26 ENCOUNTER — Telehealth: Payer: Self-pay | Admitting: Family Medicine

## 2018-12-26 ENCOUNTER — Telehealth: Payer: Self-pay | Admitting: Hematology

## 2018-12-26 VITALS — BP 124/60 | HR 52 | Ht 62.0 in | Wt 140.1 lb

## 2018-12-26 DIAGNOSIS — K7460 Unspecified cirrhosis of liver: Secondary | ICD-10-CM

## 2018-12-26 DIAGNOSIS — C9000 Multiple myeloma not having achieved remission: Secondary | ICD-10-CM

## 2018-12-26 DIAGNOSIS — D62 Acute posthemorrhagic anemia: Secondary | ICD-10-CM

## 2018-12-26 DIAGNOSIS — R52 Pain, unspecified: Secondary | ICD-10-CM

## 2018-12-26 DIAGNOSIS — N179 Acute kidney failure, unspecified: Secondary | ICD-10-CM

## 2018-12-26 DIAGNOSIS — N184 Chronic kidney disease, stage 4 (severe): Secondary | ICD-10-CM

## 2018-12-26 DIAGNOSIS — R188 Other ascites: Secondary | ICD-10-CM

## 2018-12-26 LAB — CULTURE, BODY FLUID-BOTTLE: CULTURE: NO GROWTH

## 2018-12-26 LAB — CULTURE, BODY FLUID W GRAM STAIN -BOTTLE

## 2018-12-26 MED ORDER — OXYCODONE HCL 5 MG PO CAPS: 5.0000 mg | ORAL_CAPSULE | Freq: Four times a day (QID) | ORAL | 0 refills | Status: DC | PRN

## 2018-12-26 MED ORDER — OXYCODONE HCL 5 MG PO CAPS: 5.0000 mg | ORAL_CAPSULE | Freq: Four times a day (QID) | ORAL | 0 refills | Status: AC | PRN

## 2018-12-26 NOTE — Progress Notes (Signed)
Established Patient Office Visit  Subjective:  Patient ID: Laura Sampson, female    DOB: February 17, 1946  Age: 73 y.o. MRN: [id number]  CC:  Chief Complaint  Patient presents with  . Establish Care  . Hospitalization Follow-up    HPI Laura Sampson presents for follow-up of recent hospitalization where she received multiple diagnoses to include ascites with unspecified hepatic cirrhosis.  Stage IV chronic kidney disease.  Acute renal failure.  Active multiple myeloma.  Acute blood loss anemia that had required transfusion of 2 units of blood.  She is presenting to my clinic with 3 family members asking for a hospice referral.  Past Medical History:  Diagnosis Date  . CAD (coronary artery disease)   . DM2 (diabetes mellitus, type 2) ([place])   . HTN (hypertension)   . Multiple myeloma not having achieved remission ([place])   . PAF (paroxysmal atrial fibrillation) ([place])   . Portal vein thrombosis   . Renal disorder    "low functioning" no treatment needed at this time    Past Surgical History:  Procedure Laterality Date  . COLONOSCOPY    . CORONARY ARTERY BYPASS GRAFT    . ESOPHAGOGASTRODUODENOSCOPY    . NASAL SINUS SURGERY    . PACEMAKER IMPLANT  2012   [employer]  . PARTIAL HYSTERECTOMY      Family History  Problem Relation Age of Onset  . Diabetes Mother   . Breast cancer Mother   . Diabetes Father   . Heart disease Father   . Diabetes Sister     Social History   Socioeconomic History  . Marital status: Married    Spouse name: Not on file  . Number of children: Not on file  . Years of education: Not on file  . Highest education level: Not on file  Occupational History  . Not on file  Social Needs  . Financial resource strain: Not on file  . Food insecurity:    Worry: Not on file    Inability: Not on file  . Transportation needs:    Medical: Not on file    Non-medical: Not on file  Tobacco Use  . Smoking status: Former Smoker    Years: 4.00    Last attempt  to quit: 1983    Years since quitting: 37.0  . Smokeless tobacco: Never Used  Substance and Sexual Activity  . Alcohol use: No  . Drug use: No  . Sexual activity: Not on file  Lifestyle  . Physical activity:    Days per week: Not on file    Minutes per session: Not on file  . Stress: Not on file  Relationships  . Social connections:    Talks on phone: Not on file    Gets together: Not on file    Attends religious service: Not on file    Active member of club or organization: Not on file    Attends meetings of clubs or organizations: Not on file    Relationship status: Not on file  . Intimate partner violence:    Fear of current or ex partner: Not on file    Emotionally abused: Not on file    Physically abused: Not on file    Forced sexual activity: Not on file  Other Topics Concern  . Not on file  Social History Narrative   Married, retired from [employer] to [place] - moved back to [place] 11/2018 due to health problems   Former  smoker, no EtOH   + children, grandchildren    Outpatient Medications Prior to Visit  Medication Sig Dispense Refill  . allopurinol (ZYLOPRIM) 100 MG tablet Take 200 mg by mouth daily.    . calcitRIOL (ROCALTROL) 0.25 MCG capsule Take 0.25 mcg by mouth daily.    . carvedilol (COREG) 3.125 MG tablet Take 3.125 mg by mouth 2 (two) times daily with a meal.    . digoxin (LANOXIN) 0.125 MG tablet Take 0.125 mg by mouth daily.     Marland Kitchen gabapentin (NEURONTIN) 100 MG capsule Take 100 mg by mouth 2 (two) times daily.    . hydrALAZINE (APRESOLINE) 25 MG tablet Take 25 mg by mouth 3 (three) times daily.     . insulin NPH Human (HUMULIN N,NOVOLIN N) 100 UNIT/ML injection Inject 0.05 mLs (5 Units total) into the skin 2 (two) times daily before a meal for 30 days. 3 mL 0  . levothyroxine (SYNTHROID, LEVOTHROID) 75 MCG tablet Take 75 mcg by mouth daily before breakfast.

## 2018-12-26 NOTE — Telephone Encounter (Signed)
Copied from [place] [id number]. Topic: Quick Communication - See Telephone Encounter >> Dec 26, 2018  5:03 PM Rutherford Nail, [place] wrote: CRM for notification. See Telephone encounter for: 12/26/18. Patient's daughter, Octavio Graves, calling and states that the Hydrocodone prescription that Dr Ethelene Hal wrote for the patient today was in capsule form. Would like to know if this can be switched to tablet form incase the MG is too strong for her, they would be able to cut in half? Has already dropped off paper rx to pharmacy below. Please advise.  [id number] [place], [place] - [address]#: [phone]

## 2018-12-26 NOTE — Telephone Encounter (Signed)
Received a call from the pt's daughter to reschedule appt. Appt has been made for the pt to see Dr. Irene Limbo on 1/28 at 11am.

## 2018-12-26 NOTE — Progress Notes (Deleted)
untitled image     Established Patient Office Visit      Subjective:    Patient ID: Laura Sampson, female    DOB: February 27, 1946  Age: 73 y.o. MRN: [id number]     CC:       Chief Complaint    Patient presents with    .   Establish Care    .   Hospitalization Follow-up          HPI  Laura Sampson presents for ***          Past Medical History:    Diagnosis   Date    .   CAD (coronary artery disease)        .   DM2 (diabetes mellitus, type 2) ([place])        .   HTN (hypertension)        .   Multiple myeloma not having achieved remission ([place])        .   PAF (paroxysmal atrial fibrillation) ([place])        .   Portal vein thrombosis        .   Renal disorder            "low functioning" no treatment needed at this time                Past Surgical History:    Procedure   Laterality   Date    .   COLONOSCOPY            .   CORONARY ARTERY BYPASS GRAFT            .   ESOPHAGOGASTRODUODENOSCOPY            .   NASAL SINUS SURGERY            .   PACEMAKER IMPLANT       2012        [employer]    .   PARTIAL HYSTERECTOMY                        Family History    Problem   Relation   Age of Onset    .   Diabetes   Mother        .   Breast cancer   Mother        .   Diabetes   Father        .   Heart disease   Father        .   Diabetes   Sister              Social History  Outpatient Medications Prior to Visit    Medication   Sig   Dispense   Refill    .   allopurinol (ZYLOPRIM) 100 MG tablet   Take 200 mg by mouth daily.            .   calcitRIOL (ROCALTROL) 0.25 MCG capsule   Take 0.25 mcg by mouth daily.            .   carvedilol (COREG) 3.125 MG tablet   Take 3.125 mg by mouth 2 (two) times daily with a meal.            .   digoxin (LANOXIN) 0.125 MG tablet   Take 0.125 mg by mouth daily.             Marland Kitchen   gabapentin (NEURONTIN) 100 MG capsule   Take 100 mg by mouth 2 (two) times daily.            .   hydrALAZINE (APRESOLINE) 25 MG tablet   Take 25 mg by mouth 3 (three) times daily.             .   insulin NPH Human (HUMULIN N,NOVOLIN N) 100 UNIT/ML injection   Inject 0.05 mLs (5 Units total) into the skin 2 (two) times daily before a meal for 30 days.   3 mL   0    .   levothyroxine (SYNTHROID, LEVOTHROID) 75 MCG tablet   Take 75 mcg by mouth daily before breakfast.            .   loperamide (IMODIUM A-D) 2 MG tablet   Take 1 tablet (2 mg total) by mouth 4 (four) times daily as needed for diarrhea or loose stools.   30 tablet   0    .   saccharomyces boulardii (FLORASTOR) 250 MG capsule   Take 1 capsule (250 mg total) by mouth 2 (two) times daily for 30 days.   60 capsule   0    .   sodium bicarbonate 650 MG tablet   Take 650 mg by mouth 2 (two) times daily.                No facility-administered  medications prior to visit.                 Allergies    Allergen   Reactions    .   Penicillins   Rash            Has patient had a PCN reaction causing immediate rash, facial/tongue/throat swelling, SOB or lightheadedness with hypotension: Yes  Has patient had a PCN reaction causing severe rash involving mucus membranes or skin necrosis: Yes  Has patient had a PCN reaction that required hospitalization: No  Has patient had a PCN reaction occurring within the last 10 years: Unknown  If all of the above answers are "NO", then may proceed with Cephalosporin use.       .   Clindamycin/Lincomycin   Itching          ROS  Review of Systems        Objective:       Objective  Assessment & Plan:           Problem List Items Addressed This Visit          None                No orders of the defined types were placed in this encounter.        Follow-up: No follow-ups on file.

## 2018-12-26 NOTE — Progress Notes (Signed)
Established Patient Office Visit  Subjective:  Patient ID: Laura Sampson, female    DOB: 1946-11-21  Age: 73 y.o. MRN: [id number]  CC:  Chief Complaint  Patient presents with  . Establish Care  . Hospitalization Follow-up    HPI Laura Sampson presents for presents for hospital follow-up where she received a diagnosis of ascites with unspecified hepatic cirrhosis, acute renal failure with stage IV kidney disease, active multiple myeloma and acute blood loss anemia requiring trans-fusion of 2 units of blood.  She is accompanied by 3 family members.  She is requesting a hospice referral.  She has pending appointments with gastroenterology and oncology.  She is having pain in her abdominal area.  Past Medical History:  Diagnosis Date  . CAD (coronary artery disease)   . DM2 (diabetes mellitus, type 2) ([place])   . HTN (hypertension)   . Multiple myeloma not having achieved remission ([place])   . PAF (paroxysmal atrial fibrillation) ([place])   . Portal vein thrombosis   . Renal disorder    "low functioning" no treatment needed at this time    Past Surgical History:  Procedure Laterality Date  . COLONOSCOPY    . CORONARY ARTERY BYPASS GRAFT    . ESOPHAGOGASTRODUODENOSCOPY    . NASAL SINUS SURGERY    . PACEMAKER IMPLANT  2012   [employer]. [employer]  . PARTIAL HYSTERECTOMY      Family History  Problem Relation Age of Onset  . Diabetes Mother   . Breast cancer Mother   . Diabetes Father   . Heart disease Father   . Diabetes Sister     Social History   Socioeconomic History  . Marital status: Married    Spouse name: Not on file  . Number of children: Not on file  . Years of education: Not on file  . Highest education level: Not on file  Occupational History  . Not on file  Social Needs  . Financial resource strain: Not on file  . Food insecurity:    Worry: Not on file    Inability: Not on file  . Transportation needs:    Medical: Not on file    Non-medical: Not on file    Tobacco Use  . Smoking status: Former Smoker    Years: 4.00    Last attempt to quit: 1983    Years since quitting: 37.0  . Smokeless tobacco: Never Used  Substance and Sexual Activity  . Alcohol use: No  . Drug use: No  . Sexual activity: Not on file  Lifestyle  . Physical activity:    Days per week: Not on file    Minutes per session: Not on file  . Stress: Not on file  Relationships  . Social connections:    Talks on phone: Not on file    Gets together: Not on file    Attends religious service: Not on file    Active member of club or organization: Not on file    Attends meetings of clubs or organizations: Not on file    Relationship status: Not on file  . Intimate partner violence:    Fear of current or ex partner: Not on file    Emotionally abused: Not on file    Physically abused: Not on file    Forced sexual activity: Not on file  Other Topics Concern  . Not on file  Social History Narrative   Married, retired from [employer] to [place] -  moved back to [place] 11/2018 due to health problems   Former smoker, no EtOH   + children, grandchildren    Outpatient Medications Prior to Visit  Medication Sig Dispense Refill  . allopurinol (ZYLOPRIM) 100 MG tablet Take 200 mg by mouth daily.    . calcitRIOL (ROCALTROL) 0.25 MCG capsule Take 0.25 mcg by mouth daily.    . carvedilol (COREG) 3.125 MG tablet Take 3.125 mg by mouth 2 (two) times daily with a meal.    . digoxin (LANOXIN) 0.125 MG tablet Take 0.125 mg by mouth daily.     Marland Kitchen gabapentin (NEURONTIN) 100 MG capsule Take 100 mg by mouth 2 (two) times daily.    . hydrALAZINE (APRESOLINE) 25 MG tablet Take 25 mg by mouth 3 (three) times daily.     . insulin NPH Human (HUMULIN N,NOVOLIN N) 100 UNIT/ML injection Inject 0.05 mLs (5 Units total) into the skin 2 (two) times daily before a meal for 30 days. 3 mL 0  . levothyroxine (SYNTHROID, LEVOTHROID) 75 MCG tablet Take 75 mcg by mouth daily before breakfast.    .  loperamide (IMODIUM A-D) 2 MG tablet Take 1 tablet (2 mg total) by mouth 4 (four) times daily as needed for diarrhea or loose stools. 30 tablet 0  . saccharomyces boulardii (FLORASTOR) 250 MG capsule Take 1 capsule (250 mg total) by mouth 2 (two) times daily for 30 days. 60 capsule 0  . sodium bicarbonate 650 MG tablet Take 650 mg by mouth 2 (two) times daily.     No facility-administered medications prior to visit.     Allergies  Allergen Reactions  . Penicillins Rash    Has patient had a PCN reaction causing immediate rash, facial/tongue/throat swelling, SOB or lightheadedness with hypotension: Yes Has patient had a PCN reaction causing severe rash involving mucus membranes or skin necrosis: Yes Has patient had a PCN reaction that required hospitalization: No Has patient had a PCN reaction occurring within the last 10 years: Unknown If all of the above answers are "NO", then may proceed with Cephalosporin use.   . Clindamycin/Lincomycin Itching    ROS Review of Systems  Constitutional: Positive for fatigue. Negative for diaphoresis, fever and unexpected weight change.  Respiratory: Positive for shortness of breath.   Cardiovascular: Negative.   Gastrointestinal: Positive for abdominal distention, abdominal pain, anal bleeding and blood in stool.  Genitourinary: Negative.   Neurological: Negative for light-headedness and headaches.  Psychiatric/Behavioral: Negative.       Objective:    Physical Exam  Constitutional: She is oriented to person, place, and time. She appears well-developed and well-nourished. No distress.  HENT:  Head: Normocephalic and atraumatic.  Right Ear: External ear normal.  Left Ear: External ear normal.  Eyes: Right eye exhibits no discharge. Left eye exhibits no discharge. No scleral icterus.  Neck: No JVD present. No tracheal deviation present.  Pulmonary/Chest: Effort normal. No stridor.  Neurological: She is alert and oriented to person, place, and  time.  Skin: Skin is warm and dry. She is not diaphoretic.  Psychiatric: She has a normal mood and affect. Her behavior is normal.    BP 124/60   Pulse (!) 52   Ht 5' 2"  (1.575 m)   Wt 140 lb 2 oz (63.6 kg)   SpO2 96%   BMI 25.63 kg/m  Wt Readings from Last 3 Encounters:  12/26/18 140 lb 2 oz (63.6 kg)  12/15/18 150 lb (68 kg)   BP Readings from Last 3 Encounters:  12/26/18 124/60  12/22/18 (!) 141/63  11/30/17 (!) 159/63   Guideline developer:  UpToDate (see UpToDate for funding source) Date Released: June 2014  Health Maintenance Due  Topic Date Due  . DEXA SCAN  06/13/2011    There are no preventive care reminders to display for this patient.  No results found for: TSH Lab Results  Component Value Date   WBC 3.4 (L) 12/22/2018   HGB 9.1 (L) 12/22/2018   HCT 29.2 (L) 12/22/2018   MCV 97.3 12/22/2018   PLT 103 (L) 12/22/2018   Lab Results  Component Value Date   NA 139 12/22/2018   K 4.4 12/22/2018   CO2 22 12/22/2018   GLUCOSE 171 (H) 12/22/2018   BUN 38 (H) 12/22/2018   CREATININE 2.51 (H) 12/22/2018   BILITOT 1.1 12/22/2018   ALKPHOS 253 (H) 12/22/2018   AST 184 (H) 12/22/2018   ALT 15 12/22/2018   PROT 7.1 12/22/2018   ALBUMIN 3.6 12/22/2018   ALBUMIN 3.5 12/22/2018   CALCIUM 8.9 12/22/2018   ANIONGAP 11 12/22/2018   No results found for: CHOL No results found for: HDL No results found for: LDLCALC No results found for: TRIG No results found for: CHOLHDL Lab Results  Component Value Date   HGBA1C 8.7 (H) 12/16/2018      Assessment & Plan:   Problem List Items Addressed This Visit      Digestive   Cirrhosis ([place]) - Primary   Relevant Orders   Ambulatory referral to Hospice     Genitourinary   CKD (chronic kidney disease) stage 4, GFR 15-29 ml/min ([facility]) (Chronic)   Relevant Orders   Ambulatory referral to Hospice   ARF (acute renal failure) ([place])   Relevant Orders   Ambulatory referral to Hospice     Other   Ascites   Relevant  Orders   Ambulatory referral to Hospice   Multiple myeloma not having achieved remission ([place])   Relevant Orders   Ambulatory referral to Hospice   Acute blood loss anemia   Relevant Orders   Ambulatory referral to Hospice   Pain   Relevant Medications   oxycodone (OXY-IR) 5 MG capsule   Other Relevant Orders   Ambulatory referral to Hospice      Meds ordered this encounter  Medications  . DISCONTD: oxycodone (OXY-IR) 5 MG capsule    Sig: Take 1 capsule (5 mg total) by mouth every 6 (six) hours as needed.    Dispense:  30 capsule    Refill:  0  . oxycodone (OXY-IR) 5 MG capsule    Sig: Take 1 capsule (5 mg total) by mouth every 6 (six) hours as needed.    Dispense:  30 capsule    Refill:  0    Follow-up: No follow-ups on file.   I complied with patient's request and asked for an urgent hospice referral.  Encouraged her and the family to follow-up with gastroenterology and oncology.  As she was able.  She was given a starting dose of oxycodone respecting her current liver and renal functions.  Family and patient will use the emergency is

## 2018-12-27 NOTE — Telephone Encounter (Signed)
Please advise 

## 2018-12-27 NOTE — Telephone Encounter (Signed)
I called and spoke with pharmacy. They will fill the Rx with tablets because they do not carry the capsules. I called patient's daughter and made her aware; nothing further needed at this time.

## 2019-01-01 NOTE — Progress Notes (Deleted)
Cardiology Office Note   Date:  01/01/2019   ID:  Laura, Sampson 1946/04/22, MRN [id number]  PCP:  Libby Maw, MD  Cardiologist:   Peter Martinique, MD   No chief complaint on file.     History of Present Illness: Laura Sampson is a 73 y.o. female who is seen at the request of Dr Ethelene Hal to evaluate CAD and AFib. She was recently hospitalized her from 1/8-1/16/20 for evaluation of ascites, Epistaxis, C.Diff colitis. She underwent paracentesis. Felt to have cirrhosis. Had portal vein thrombosis and was on Eliquis but this was held due to epistaxis. She had chest pain but troponins were flat. Echo showed normal LV function. Due to elevated creatinine diuretics were held. Creatinine improved to 2.51. She has also recently been diagnosed with multiple myeloma. Oncology follow up planned.   She has a history of DM, HTN, PAF. She has a [place] in [place].  She presented in 2018 with progressive angina.She had a stress test followed by cardiac cath which showed severe 2V CAD (LAD, LCx). Referred for evaluation for CABG. She is S/P CABG x 2 (LIMA to mid LAD, SVG to OM1), bilateral PVI using RFA, LAA occlusion with 35 mm AtriClip, EVH LLE on 09/06/2017 per Dr. Maisie Fus at [facility]. Her postoperative course was remarkable for AKI, anemia due to blood loss, increased O2 requirements, A-fib that converted to NSR with amiodarone, infected vein graft site requiring wash out, and C. Diff. She was discharged home on POD # 24 in stable condition. She had sternal wound dehiscence and delayed healing but this did eventually heal.       Past Medical History:  Diagnosis Date  . CAD (coronary artery disease)   . DM2 (diabetes mellitus, type 2) ([place])   . HTN (hypertension)   . Multiple myeloma not having achieved remission ([place])   . PAF (paroxysmal atrial fibrillation) ([place])   . Portal vein thrombosis   . Renal disorder    "low functioning" no treatment needed at  this time    Past Surgical History:  Procedure Laterality Date  . COLONOSCOPY    . CORONARY ARTERY BYPASS GRAFT    . ESOPHAGOGASTRODUODENOSCOPY    . NASAL SINUS SURGERY    . PACEMAKER IMPLANT  2012   [employer]. Jude  . PARTIAL HYSTERECTOMY       Current Outpatient Medications  Medication Sig Dispense Refill  . allopurinol (ZYLOPRIM) 100 MG tablet Take 200 mg by mouth daily.    . calcitRIOL (ROCALTROL) 0.25 MCG capsule Take 0.25 mcg by mouth daily.    . carvedilol (COREG) 3.125 MG tablet Take 3.125 mg by mouth 2 (two) times daily with a meal.    . digoxin (LANOXIN) 0.125 MG tablet Take 0.125 mg by mouth daily.     Marland Kitchen gabapentin (NEURONTIN) 100 MG capsule Take 100 mg by mouth 2 (two) times daily.    . hydrALAZINE (APRESOLINE) 25 MG tablet Take 25 mg by mouth 3 (three) times daily.     . insulin NPH Human (HUMULIN N,NOVOLIN N) 100 UNIT/ML injection Inject 0.05 mLs (5 Units total) into the skin 2 (two) times daily before a meal for 30 days. 3 mL 0  . levothyroxine (SYNTHROID, LEVOTHROID) 75 MCG tablet Take 75 mcg by mouth daily before breakfast.    . loperamide (IMODIUM A-D) 2 MG tablet Take 1 tablet (2 mg total) by mouth 4 (four) times daily as needed for diarrhea or loose  stools. 30 tablet 0  . oxycodone (OXY-IR) 5 MG capsule Take 1 capsule (5 mg total) by mouth every 6 (six) hours as needed. 30 capsule 0  . saccharomyces boulardii (FLORASTOR) 250 MG capsule Take 1 capsule (250 mg total) by mouth 2 (two) times daily for 30 days. 60 capsule 0  . sodium bicarbonate 650 MG tablet Take 650 mg by mouth 2 (two) times daily.     No current facility-administered medications for this visit.     Allergies:   Penicillins and Clindamycin/lincomycin    Social History:  The patient  reports that she quit smoking about 37 years ago. She quit after 4.00 years of use. She has never used smokeless tobacco. She reports that she does not drink alcohol or use drugs.   Family History:  The patient's ***family  history includes Breast cancer in her mother; Diabetes in her father, mother, and sister; Heart disease in her father.    ROS:  Please see the history of present illness.   Otherwise, review of systems are positive for {NONE DEFAULTED:18576::"none"}.   All other systems are reviewed and negative.    PHYSICAL EXAM: VS:  There were no vitals taken for this visit. , BMI There is no height or weight on file to calculate BMI. GEN: Well nourished, well developed, in no acute distress  HEENT: normal  Neck: no JVD, carotid bruits, or masses Cardiac: ***RRR; no murmurs, rubs, or gallops,no edema  Respiratory:  clear to auscultation bilaterally, normal work of breathing GI: soft, nontender, nondistended, + BS MS: no deformity or atrophy  Skin: warm and dry, no rash Neuro:  Strength and sensation are intact Psych: euthymic mood, full affect   EKG:  EKG {ACTION; IS/IS [id number]} ordered today. The ekg ordered today demonstrates ***   Recent Labs: 12/22/2018: ALT 15; BUN 38; Creatinine, Ser 2.51; Hemoglobin 9.1; Magnesium 2.0; Platelets 103; Potassium 4.4; Sodium 139    Lipid Panel No results found for: CHOL, TRIG, HDL, CHOLHDL, VLDL, LDLCALC, LDLDIRECT    Wt Readings from Last 3 Encounters:  12/26/18 140 lb 2 oz (63.6 kg)  12/15/18 150 lb (68 kg)      Other studies Reviewed: Additional studies/ records that were reviewed today include:   Echo 12/19/18: Study Conclusions  - Procedure narrative: Transthoracic echocardiography. Image   quality was poor. The study was technically difficult, as a   result of poor acoustic windows. - Left ventricle: The cavity size was normal. There was moderate   concentric hypertrophy. Systolic function was vigorous. The   estimated ejection fraction was in the range of 65% to 70%.   Incoordinate septal motion. The study is not technically   sufficient to allow evaluation of LV diastolic function. - Mitral valve: Mildly thickened leaflets . There  was moderate   regurgitation. - Left atrium: The atrium was normal in size. - Right ventricle: Mildly dilated, normal systolic function. Coarse   trabeulation at the apex. Pacer wire or catheter noted in right   ventricle - apperas to extend to the free wall. - Right atrium: The atrium was mildly dilated. Pacer wire or   catheter noted in right atrium. - Tricuspid valve: There was severe regurgitation. - Pulmonary arteries: PA peak pressure: 54 mm Hg (S). - Inferior vena cava: The vessel was normal in size. The   respirophasic diameter changes were in the normal range (>= 50%),   consistent with normal central venous pressure.  Impressions:  - Technically difficult study. LVEF 65-70%, moderate  LVH,   incoordinate septal motion, moderate MR, normal LA size, severe   TR, wire or catheter seen in the right heart extending toward the   RV free wall, mildly dilated RV with normal systolic function,   severe TR, RVSP 54 mmHg, normal IVC.   ASSESSMENT AND PLAN:  1.  ***   Current medicines are reviewed at length with the patient today.  The patient {ACTIONS; HAS/DOES NOT HAVE:19233} concerns regarding medicines.  The following changes have been made:  {PLAN; NO CHANGE:13088:s}  Labs/ tests ordered today include: *** No orders of the defined types were placed in this encounter.    Disposition:   FU with *** in {gen number 9-09:[id number]} {Days to years:10300}  Signed, Peter Martinique, MD  01/01/2019 8:17 AM    [facility] [address], [place], [place], [address] Phone [phone], Fax [phone]

## 2019-01-03 ENCOUNTER — Telehealth: Payer: Self-pay | Admitting: Family Medicine

## 2019-01-03 ENCOUNTER — Ambulatory Visit: Payer: Medicare Other | Admitting: Hematology

## 2019-01-03 NOTE — Telephone Encounter (Signed)
Forms placed up front for patient's son to come pick up. He is aware. Copy scanned to patient's chart.

## 2019-01-03 NOTE — Telephone Encounter (Signed)
Forms filled out & placed on provider's desk for signature.

## 2019-01-03 NOTE — Telephone Encounter (Signed)
Patient son, Laura Sampson, came by and dropped off FMLA paper to be filled out for patient son. Patient is in hospices now and patient son is needing to care for his mother. Please call patient at [phone] once forms are filled out. He will pick them up as well. Forms are in [facility]'s folder at the front

## 2019-01-05 ENCOUNTER — Ambulatory Visit: Payer: Medicare Other | Admitting: Cardiology

## 2019-01-12 ENCOUNTER — Telehealth: Payer: Self-pay | Admitting: Family Medicine

## 2019-01-12 NOTE — Telephone Encounter (Signed)
Copied from [place] [id number]. Topic: Quick Communication - See Telephone Encounter >> Jan 12, 2019  4:10 PM Bea Graff, NT wrote: CRM for notification. See Telephone encounter for: 01/12/19. Nancy with [facility] calling to let the office know pt is no longer under their care as she has transferred to [facility]. CB#: [phone].

## 2019-01-12 NOTE — Telephone Encounter (Signed)
fyi

## 2019-01-20 ENCOUNTER — Ambulatory Visit: Payer: Medicare Other | Admitting: Gastroenterology

## 2019-02-05 DEATH — deceased

## 2019-02-10 ENCOUNTER — Telehealth: Payer: Self-pay | Admitting: Family Medicine

## 2019-02-10 NOTE — Telephone Encounter (Signed)
Copied from [place] [id number]. Topic: General - Other >> Feb 10, 2019 10:52 AM Antonieta Iba C wrote: Reason for CRM: Whitney with [facility]osp[facility] is calling in to follow up on a medication order form for pt. Loree Fee says that they have faxed this a few times previously. Requested that the form be refaxed just in case it's needed.   Please assist.    CB #, if needed : [phone]

## 2019-02-10 NOTE — Telephone Encounter (Signed)
Spoke with Hospice pt. Transferred to [facility].

## 2020-03-16 IMAGING — CT CT RENAL STONE PROTOCOL
2 of 7 series · 14 of 46 positions shown, 18 images · non-contrast
Comparison: Chest CT report 10/19/2000

CLINICAL DATA: Abdominal distention and pain for 2 weeks.

EXAM:
CT ABDOMEN AND PELVIS WITHOUT CONTRAST
TECHNIQUE: Multidetector CT imaging of the abdomen and pelvis was performed
following the standard protocol without IV contrast.

[Series 4: axial st · axial · 0.74mm/px · z∈[+16,+392]mm · 11 of 87 slices shown, 15 images]
[im 6/87  soft-tissue]
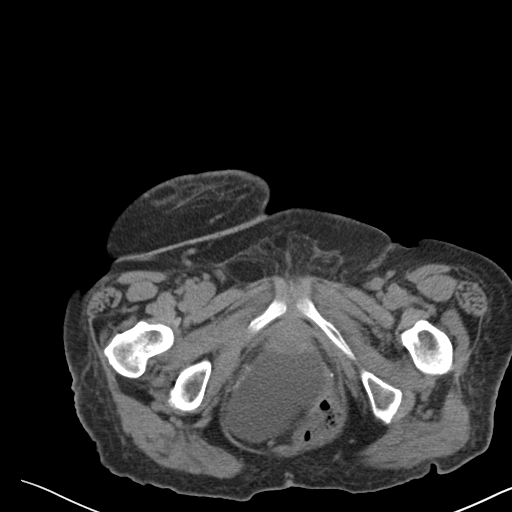
[im 6/87  bone]
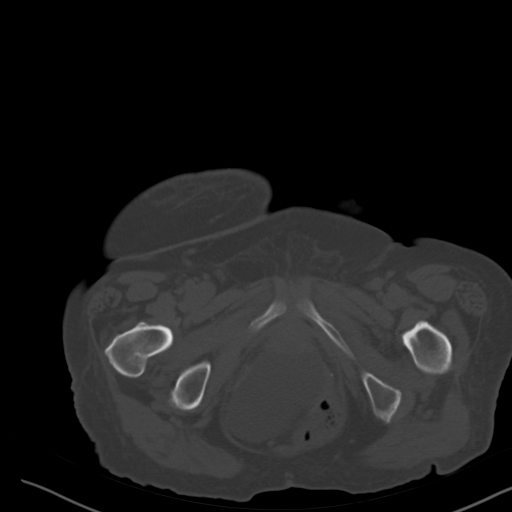
[im 16/87  soft-tissue]
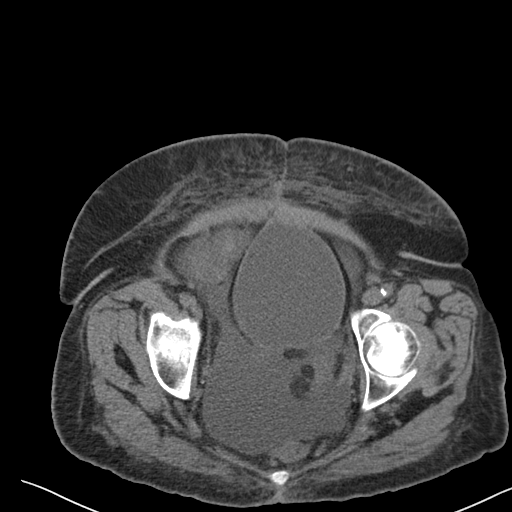
[im 26/87  soft-tissue]
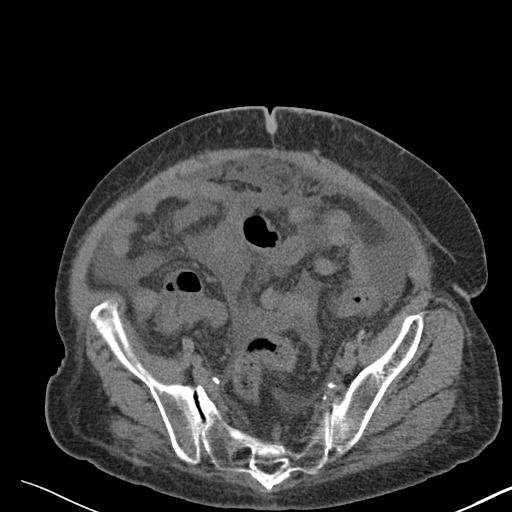
[im 36/87  soft-tissue]
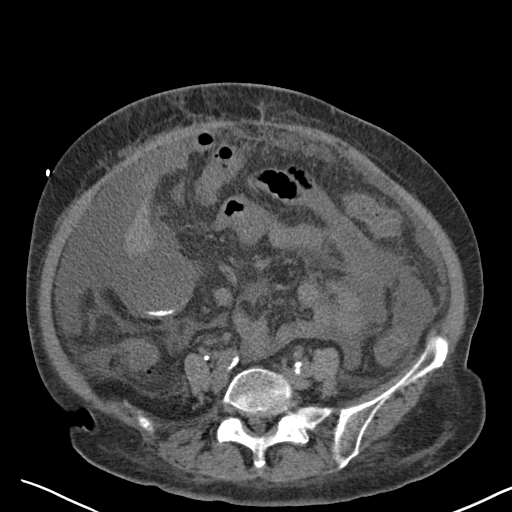
[im 46/87  soft-tissue]
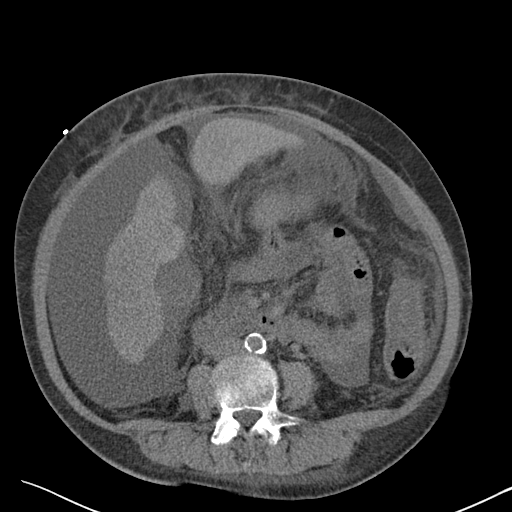
[im 51/87  soft-tissue]
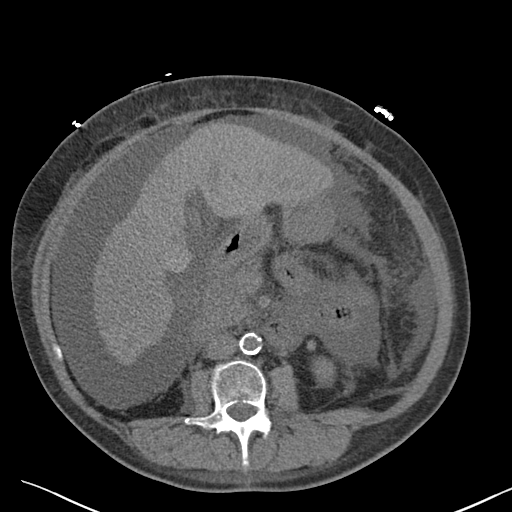
[im 61/87  soft-tissue]
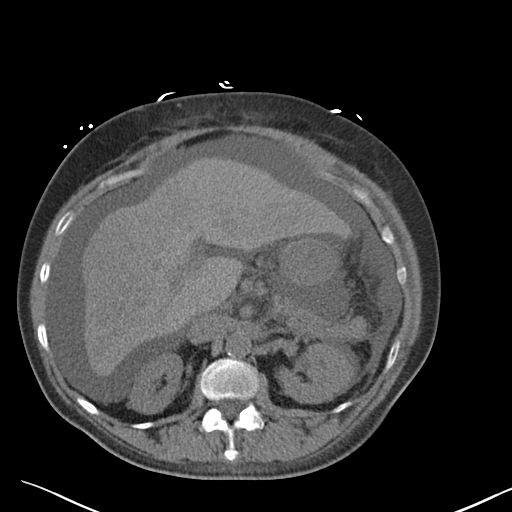
[im 66/87  lung]
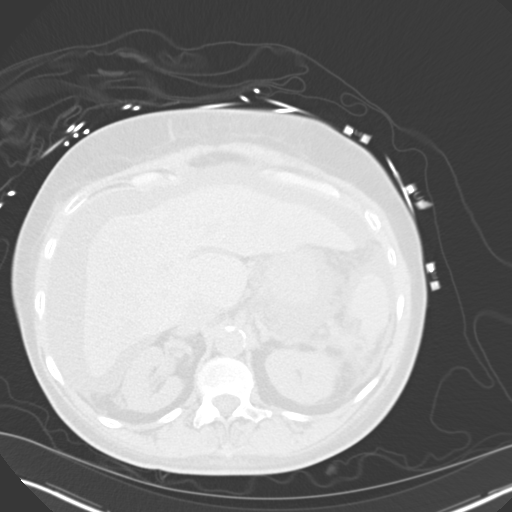
[im 71/87  soft-tissue]
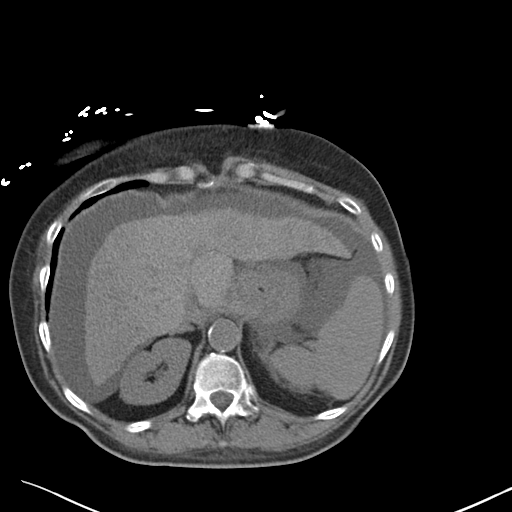
[im 71/87  lung]
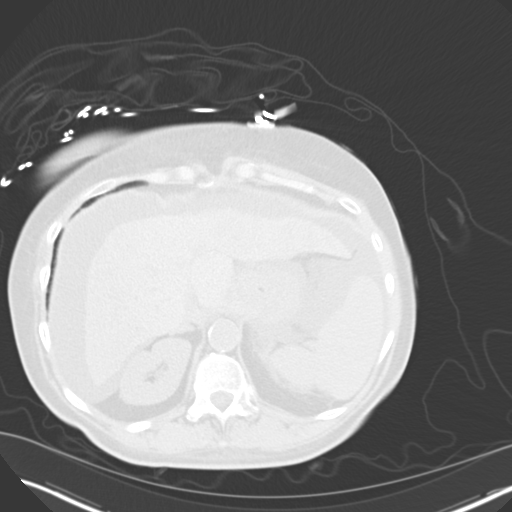
[im 76/87  lung]
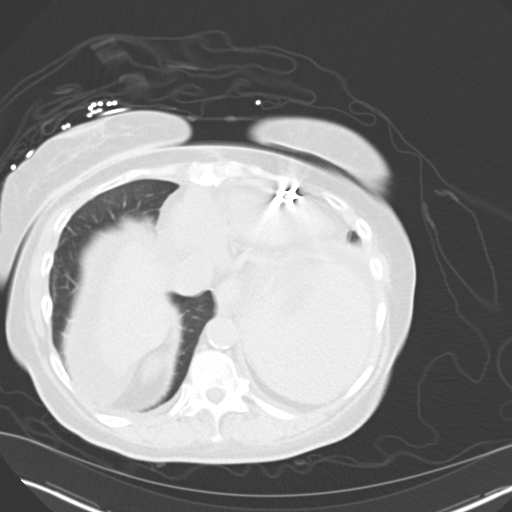
[im 81/87  soft-tissue]
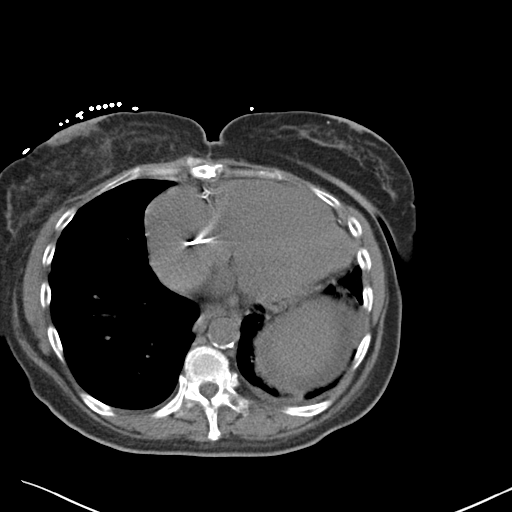
[im 81/87  lung]
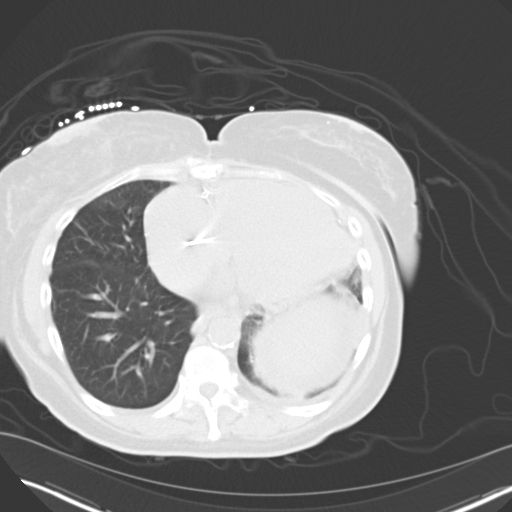
[im 81/87  bone]
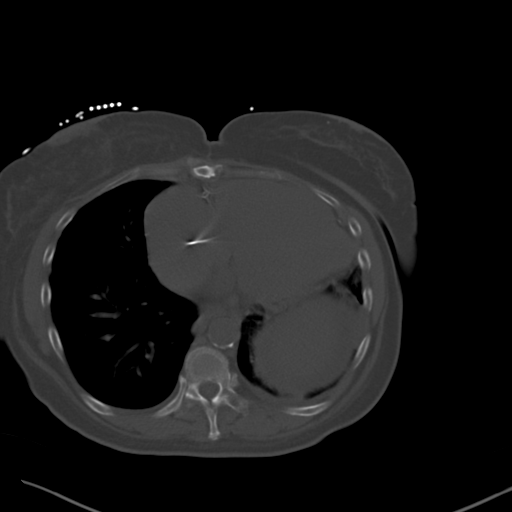

[Series 9: coronal · coronal · 0.73mm/px · 3 of 155 slices shown]
[im 31/155  soft-tissue]
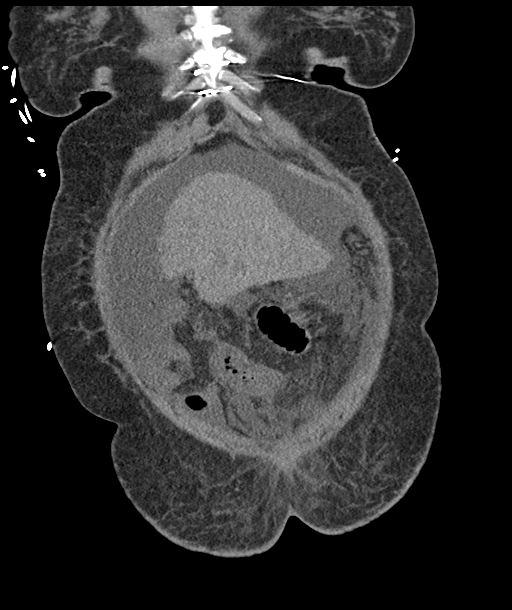
[im 62/155  soft-tissue]
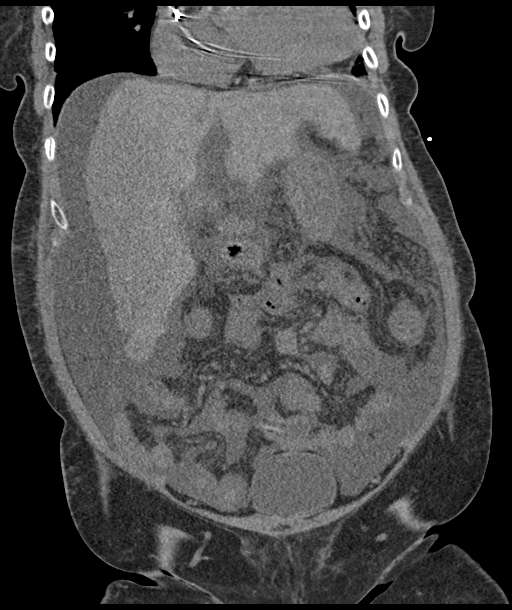
[im 93/155  soft-tissue]
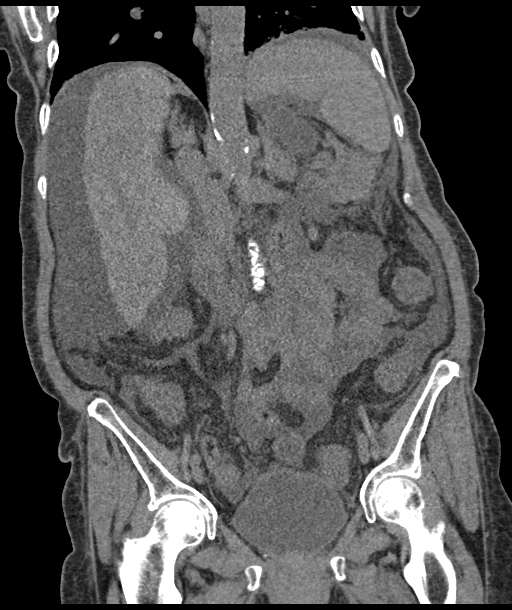

[14 of 46 positions shown; findings below may reference images not displayed]

FINDINGS: Lower chest: Cardiomegaly with right atrial and right ventricular
leads identified. No pericardial effusion or thickening. Mild
bronchiectasis to the lower lobes with left lower lobe atelectasis.
No effusion or pneumothorax.

Hepatobiliary: Cirrhotic appearing liver without definite
space-occupying mass given limitations of a noncontrast study. Tiny
granuloma in the right hepatic lobe. Gallbladder is physiologically
distended and contains layering gallstones. No mural thickening is
identified.

Pancreas: Normal without ductal dilatation

Spleen: Normal size spleen without mass.

Adrenals/Urinary Tract: Normal bilateral adrenal glands and kidneys
without nephrolithiasis nor hydroureteronephrosis. The urinary
bladder is unremarkable.

Stomach/Bowel: Stomach is within normal limits. Appendix is not
confidently identified. No evidence of bowel wall thickening,
distention, or inflammatory changes.

Vascular/Lymphatic: Moderate aortoiliac atherosclerosis.
Lymphadenopathy.

Reproductive: Uterus appears surgically absent. No adnexal mass is
seen.

Other: Moderate to large volume of ascites with soft tissue
anasarca.

Musculoskeletal: No acute nor suspicious osseous abnormalities. Mild
anterior height loss of L4 without retropulsion, likely chronic.
Facet arthrosis is noted of the lumbar spine.
IMPRESSION: 1. Moderate to large volume of ascites with soft tissue anasarca.
2. Cirrhotic appearing liver without definite space-occupying mass
given limitations of a noncontrast study.
3. Uncomplicated cholelithiasis.
4. Aortoiliac atherosclerosis.
